# Patient Record
Sex: Male | Born: 1985 | Race: Black or African American | Hispanic: No | Marital: Married | Smoking: Current every day smoker
Health system: Southern US, Community
[De-identification: ages and names within clinical notes are randomized; demographics above are authoritative.]

## PROBLEM LIST (undated history)

## (undated) VITALS — BP 119/89 | HR 74 | Temp 98.4°F | Resp 18

## (undated) DIAGNOSIS — F319 Bipolar disorder, unspecified: Secondary | ICD-10-CM

## (undated) DIAGNOSIS — S02609A Fracture of mandible, unspecified, initial encounter for closed fracture: Secondary | ICD-10-CM

---
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
## 2007-01-15 ENCOUNTER — Emergency Department (HOSPITAL_COMMUNITY): Admission: EM | Admit: 2007-01-15 | Discharge: 2007-01-15 | Payer: Self-pay | Admitting: Emergency Medicine

## 2008-08-06 ENCOUNTER — Emergency Department (HOSPITAL_COMMUNITY): Admission: EM | Admit: 2008-08-06 | Discharge: 2008-08-06 | Payer: Self-pay | Admitting: Emergency Medicine

## 2008-09-10 ENCOUNTER — Emergency Department (HOSPITAL_COMMUNITY): Admission: EM | Admit: 2008-09-10 | Discharge: 2008-09-10 | Payer: Self-pay | Admitting: Emergency Medicine

## 2008-09-22 ENCOUNTER — Emergency Department (HOSPITAL_COMMUNITY): Admission: EM | Admit: 2008-09-22 | Discharge: 2008-09-22 | Payer: Self-pay | Admitting: Emergency Medicine

## 2008-10-01 ENCOUNTER — Emergency Department (HOSPITAL_COMMUNITY): Admission: EM | Admit: 2008-10-01 | Discharge: 2008-10-01 | Payer: Self-pay | Admitting: Emergency Medicine

## 2008-10-02 ENCOUNTER — Emergency Department (HOSPITAL_COMMUNITY): Admission: EM | Admit: 2008-10-02 | Discharge: 2008-10-02 | Payer: Self-pay | Admitting: Emergency Medicine

## 2008-10-20 ENCOUNTER — Emergency Department (HOSPITAL_COMMUNITY): Admission: EM | Admit: 2008-10-20 | Discharge: 2008-10-20 | Payer: Self-pay | Admitting: Emergency Medicine

## 2008-10-28 ENCOUNTER — Emergency Department (HOSPITAL_COMMUNITY): Admission: EM | Admit: 2008-10-28 | Discharge: 2008-10-28 | Payer: Self-pay | Admitting: Emergency Medicine

## 2008-11-04 ENCOUNTER — Emergency Department (HOSPITAL_COMMUNITY): Admission: EM | Admit: 2008-11-04 | Discharge: 2008-11-04 | Payer: Self-pay | Admitting: Emergency Medicine

## 2011-04-11 ENCOUNTER — Inpatient Hospital Stay (HOSPITAL_COMMUNITY)
Admission: EM | Admit: 2011-04-11 | Discharge: 2011-04-12 | DRG: 918 | Disposition: A | Discharge status: Home / Self Care | Payer: Self-pay | Attending: Family Medicine | Admitting: Family Medicine

## 2011-04-11 DIAGNOSIS — R4182 Altered mental status, unspecified: Secondary | ICD-10-CM | POA: Diagnosis present

## 2011-04-11 DIAGNOSIS — T43591A Poisoning by other antipsychotics and neuroleptics, accidental (unintentional), initial encounter: Secondary | ICD-10-CM | POA: Diagnosis present

## 2011-04-11 DIAGNOSIS — F39 Unspecified mood [affective] disorder: Secondary | ICD-10-CM

## 2011-04-11 DIAGNOSIS — F319 Bipolar disorder, unspecified: Secondary | ICD-10-CM | POA: Diagnosis present

## 2011-04-11 DIAGNOSIS — T426X1A Poisoning by other antiepileptic and sedative-hypnotic drugs, accidental (unintentional), initial encounter: Secondary | ICD-10-CM | POA: Diagnosis present

## 2011-04-11 DIAGNOSIS — F209 Schizophrenia, unspecified: Secondary | ICD-10-CM | POA: Diagnosis present

## 2011-04-11 DIAGNOSIS — T43294A Poisoning by other antidepressants, undetermined, initial encounter: Principal | ICD-10-CM | POA: Diagnosis present

## 2011-04-11 DIAGNOSIS — F17200 Nicotine dependence, unspecified, uncomplicated: Secondary | ICD-10-CM | POA: Diagnosis present

## 2011-04-11 LAB — CARBAMAZEPINE LEVEL, TOTAL: Carbamazepine Lvl: 39.6 ug/mL (ref 4.0–12.0)

## 2011-04-11 LAB — DIFFERENTIAL
Basophils Absolute: 0 10*3/uL (ref 0.0–0.1)
Basophils Relative: 1 % (ref 0–1)
Eosinophils Absolute: 0 10*3/uL (ref 0.0–0.7)
Eosinophils Relative: 0 % (ref 0–5)
Lymphocytes Relative: 21 % (ref 12–46)
Lymphs Abs: 0.9 10*3/uL (ref 0.7–4.0)
Monocytes Absolute: 0.4 10*3/uL (ref 0.1–1.0)
Monocytes Relative: 10 % (ref 3–12)
Neutro Abs: 2.7 10*3/uL (ref 1.7–7.7)
Neutrophils Relative %: 68 % (ref 43–77)

## 2011-04-11 LAB — CBC
HCT: 35.9 % — ABNORMAL LOW (ref 39.0–52.0)
MCHC: 33.4 g/dL (ref 30.0–36.0)
Platelets: 333 10*3/uL (ref 150–400)
RDW: 12.5 % (ref 11.5–15.5)
WBC: 4 10*3/uL (ref 4.0–10.5)

## 2011-04-11 LAB — ETHANOL: Alcohol, Ethyl (B): 78 mg/dL — ABNORMAL HIGH (ref 0–11)

## 2011-04-11 LAB — COMPREHENSIVE METABOLIC PANEL
ALT: 17 U/L (ref 0–53)
Albumin: 4.1 g/dL (ref 3.5–5.2)
BUN: 15 mg/dL (ref 6–23)
Calcium: 9.4 mg/dL (ref 8.4–10.5)
GFR calc Af Amer: >90 mL/min (ref >90)
Glucose, Bld: 112 mg/dL — ABNORMAL HIGH (ref 70–99)
Potassium: 3.4 mEq/L — ABNORMAL LOW (ref 3.5–5.1)
Sodium: 139 mEq/L (ref 135–145)
Total Protein: 7.8 g/dL (ref 6.0–8.3)

## 2011-04-11 LAB — GLUCOSE, CAPILLARY: Glucose-Capillary: 100 mg/dL — ABNORMAL HIGH (ref 70–99)

## 2011-04-11 LAB — VALPROIC ACID LEVEL: Valproic Acid Lvl: <10 ug/mL — ABNORMAL LOW (ref 50.0–100.0)

## 2011-04-11 LAB — LITHIUM LEVEL: Lithium Lvl: <0.25 mEq/L — ABNORMAL LOW (ref 0.80–1.40)

## 2011-04-12 LAB — CBC
MCH: 27.7 pg (ref 26.0–34.0)
MCV: 87.2 fL (ref 78.0–100.0)
Platelets: 321 10*3/uL (ref 150–400)
RDW: 12.7 % (ref 11.5–15.5)

## 2011-04-12 LAB — BASIC METABOLIC PANEL
Calcium: 9 mg/dL (ref 8.4–10.5)
Creatinine, Ser: 1.15 mg/dL (ref 0.50–1.35)
GFR calc Af Amer: >90 mL/min (ref >90)
GFR calc non Af Amer: 88 mL/min — ABNORMAL LOW (ref >90)
Sodium: 142 mEq/L (ref 135–145)

## 2011-04-12 LAB — CARBAMAZEPINE LEVEL, TOTAL: Carbamazepine Lvl: 17 ug/mL (ref 4.0–12.0)

## 2011-04-13 NOTE — H&P (Signed)
Edward Mora, Edward Mora             ACCOUNT NO.:  [id number]  MEDICAL RECORD NO.:  [id number]  LOCATION:  WLED                         FACILITY:  [facility]  PHYSICIAN:  Hollice Espy, M.D.DATE OF BIRTH:  September 15, 1985  DATE OF ADMISSION:  04/11/2011 DATE OF DISCHARGE:                             HISTORY & PHYSICAL   PRIMARY CARE DOCTOR:  The patient has no primary care doctor.  CHIEF COMPLAINT:  Overdose.  HISTORY OF PRESENT ILLNESS:  The patient is a 25 year old African American male who was just discharged from prison 1 day earlier, who takes Wellbutrin and Tegretol and was brought in for altered mental status.  He reportedly had been crushing up his Wellbutrin and Tegretol and snorting it.  It is unclear exactly how many he took, but he looked to be confused.  At times, he is busy and slightly agitated and tried to leaving was made on an involuntary commitment.  After discussion with the Poison Control, it was recommended that the patient be monitored on telemetry and Psychiatry be consulted to make sure that this was not an intentional suicide attempt.  In the meantime, his levels came back noting an alcohol level of 78 and a Tegretol level of 39.6 with a range of 4-12.  When I saw the patient, he was sleeping.  When I talked to him, he opened his eyes.  He shook his head when I asked him if he had any complaints, he was having any pain, he otherwise said no.  He looked to be otherwise doing well, but would not answer any other questions, I am unable to get any further review of systems.  PAST MEDICAL HISTORY: 1. History of schizophrenia. 2. Bipolar disorder. 3. Attention deficit disorder.  MEDICATIONS:  He is on Tegretol and Wellbutrin.  ALLERGIES:  No known drug allergies.  SOCIAL HISTORY:  Apparently, he is a smoker.  Has had problems in the past with drug use and drinks alcohol.  He was just released from prison 1 day prior.  He will not talk me and give me any  family history.  PHYSICAL EXAMINATION:  VITAL SIGNS:  On admission, temperature 98.7, heart rate 108 now down to 77, blood pressure 118/81 now up to 142/91, respirations 20, and O2 saturations are 100% on room air. GENERAL:  He appears to be alert, but I am unable to get his exact mentation to how oriented he is. HEENT:  Normocephalic, atraumatic.  Mucous membranes appear to be slightly dry. NECK:  He has no carotid bruits. HEART:  Regular rate and rhythm, S1 and S2. LUNGS:  Clear to auscultation bilaterally. ABDOMEN:  Soft, nontender, nondistended.  Positive bowel sounds. EXTREMITIES:  No clubbing, cyanosis, or edema.  Good 2+ pulses.  LABORATORY WORK:  White count 4, hemoglobin and hematocrit 12 and 36, MCV of 85, platelet count 333, no shift.  Sodium 139, potassium 3.4, chloride 104, bicarbonate 22, BUN 15, creatinine 1.22, glucose 112. LFTs are unremarkable.  Tylenol level less than 15.  Salicylate level less than 2.  Alcohol level 78.  Tricyclic drug screen and urine drug screen are pending.  He has not urinated.  Tegretol level is 40 with a range of 4-12,  Depakote level, lithium level, Tylenol level all normal.  IMAGING:  EKG at the time was taken, was noted some mild tachycardia. There is a question of some abnormal repolarization pattern, but I felt likely this may be an artifact.  ASSESSMENT AND PLAN: 1. Tegretol and Wellbutrin overdose, likely accidental, brought on by     trying to become high from medication by snorting it.  We will plan     to watch the patient on telemetry, keep a sitter in his room until     he is cleared by Psychiatry.  He has been involuntarily committed.     Repeat Tegretol level in the morning.  If Psychiatry clears him and     his Tegretol level is normalized, he could go home. 2. History of bipolar disorder. 3. History of schizophrenia. 4. History of attention deficit disorder. 5. History of seizure disorder.  Please also note that all  diagnoses were present on admission.     Hollice Espy, M.D.     SKK/MEDQ  D:  04/11/2011  T:  04/11/2011  Job:  [id number]  Electronically Signed by Virginia Rochester M.D. on 04/13/2011 10:21:32 AM

## 2011-04-16 NOTE — Discharge Summary (Signed)
  NAMEEDWAR, COE             ACCOUNT NO.:  [id number]  MEDICAL RECORD NO.:  [id number]  LOCATION:  1517                         FACILITY:  [facility]  PHYSICIAN:  Edward Sacks, Edward Mora    DATE OF BIRTH:  Nov 10, 1985  DATE OF ADMISSION:  04/11/2011 DATE OF DISCHARGE:  04/12/2011                              DISCHARGE SUMMARY   DISCHARGE DIAGNOSES: 1. Tegretol and Wellbutrin overdose. 2. History of bipolar and schizophrenia.  HPI:  This is a 25 year old man who was just discharged from prison, who Wellbutrin and Tegretol, who was brought in for altered mental status. He reportedly had been crushing his Wellbutrin and Tegretol and snorting it.  The patient tells me he just took multiple Tegretol in an attempt to get high.  He was found to be intoxicated and have an elevated Tegretol level, so he was admitted for observation.  HOSPITAL COURSE:  Edward Mora has had no arrhythmias and no complications or sequela from his ingestion.  He adamantly denies any suicidal thoughts or plans.  He states he took this medication to get high and that he will not do it again.  I discussed the case with Dr. Rogers Blocker of Psychiatry who does not feel that the patient is a danger to himself or others and has cleared him for discharge.  Therefore, the patient will be discharged home.  CONSULTATIONS:  Dr. Rogers Blocker of Psychiatry.  RECOMMENDATIONS:  As above.  PROCEDURES:  None.  IMAGING:  None.  MICROBIOLOGY:  None.  PERTINENT LABORATORY STUDIES: 1. CBC notable for hemoglobin of 10.8 to 12.0. 2. Basic metabolic panel, unremarkable. 3. Acetaminophen levels were negative. 4. Initial Tegretol level was 39.6, second level 17.0, third level     pending. 5. Lithium and salicylate levels negative. 6. Valproic acid level negative. 7. Alcohol level of 78.  DISCHARGE INSTRUCTIONS:  Patient will be discharged home.  Diet and activity is unrestricted.  He will follow up with [facility]  for eligibility and with Dr. Clelia Croft, December 14th at 10.45 a.m.  DISCHARGE MEDICATIONS:  No changes made.  He should continue Tegretol and Wellbutrin as directed by his outpatient physician.  Time coordinating discharge is 20 minutes.     Edward Sacks, Edward Mora     DG/MEDQ  D:  04/12/2011  T:  04/13/2011  Job:  [id number]  Electronically Signed by Edward Mora  on 04/16/2011 02:58:48 PM

## 2014-09-04 ENCOUNTER — Encounter (HOSPITAL_COMMUNITY): Payer: Self-pay | Admitting: *Deleted

## 2014-09-04 ENCOUNTER — Emergency Department (HOSPITAL_COMMUNITY)
Admission: EM | Admit: 2014-09-04 | Discharge: 2014-09-04 | Disposition: A | Discharge status: Home / Self Care | Payer: Self-pay | Attending: Emergency Medicine | Admitting: Emergency Medicine

## 2014-09-04 DIAGNOSIS — R109 Unspecified abdominal pain: Secondary | ICD-10-CM | POA: Insufficient documentation

## 2014-09-04 DIAGNOSIS — M545 Low back pain: Secondary | ICD-10-CM | POA: Insufficient documentation

## 2014-09-04 DIAGNOSIS — Z720 Tobacco use: Secondary | ICD-10-CM | POA: Insufficient documentation

## 2014-09-04 DIAGNOSIS — R252 Cramp and spasm: Secondary | ICD-10-CM | POA: Insufficient documentation

## 2014-09-04 LAB — COMPREHENSIVE METABOLIC PANEL
ALT: 23 U/L (ref 0–53)
AST: 49 U/L — AB (ref 0–37)
Albumin: 3.7 g/dL (ref 3.5–5.2)
Alkaline Phosphatase: 75 U/L (ref 39–117)
Anion gap: 9 (ref 5–15)
BUN: 9 mg/dL (ref 6–23)
CHLORIDE: 107 mmol/L (ref 96–112)
CO2: 24 mmol/L (ref 19–32)
Calcium: 9 mg/dL (ref 8.4–10.5)
Creatinine, Ser: 1.22 mg/dL (ref 0.50–1.35)
GFR calc Af Amer: >90 mL/min (ref >90)
GFR, EST NON AFRICAN AMERICAN: 80 mL/min — AB (ref >90)
Glucose, Bld: 93 mg/dL (ref 70–99)
Potassium: 3.9 mmol/L (ref 3.5–5.1)
Sodium: 140 mmol/L (ref 135–145)
TOTAL PROTEIN: 7.5 g/dL (ref 6.0–8.3)
Total Bilirubin: 0.6 mg/dL (ref 0.3–1.2)

## 2014-09-04 LAB — LIPASE, BLOOD: LIPASE: 23 U/L (ref 11–59)

## 2014-09-04 LAB — CBC WITH DIFFERENTIAL/PLATELET
BASOS ABS: 0 10*3/uL (ref 0.0–0.1)
Basophils Relative: 1 % (ref 0–1)
EOS PCT: 2 % (ref 0–5)
Eosinophils Absolute: 0.1 10*3/uL (ref 0.0–0.7)
HCT: 39.7 % (ref 39.0–52.0)
Hemoglobin: 12.5 g/dL — ABNORMAL LOW (ref 13.0–17.0)
LYMPHS ABS: 2.4 10*3/uL (ref 0.7–4.0)
Lymphocytes Relative: 49 % — ABNORMAL HIGH (ref 12–46)
MCH: 28.3 pg (ref 26.0–34.0)
MCHC: 31.5 g/dL (ref 30.0–36.0)
MCV: 89.8 fL (ref 78.0–100.0)
Monocytes Absolute: 0.7 10*3/uL (ref 0.1–1.0)
Monocytes Relative: 13 % — ABNORMAL HIGH (ref 3–12)
Neutro Abs: 1.7 10*3/uL (ref 1.7–7.7)
Neutrophils Relative %: 35 % — ABNORMAL LOW (ref 43–77)
Platelets: 346 10*3/uL (ref 150–400)
RBC: 4.42 MIL/uL (ref 4.22–5.81)
RDW: 13.9 % (ref 11.5–15.5)
WBC: 4.9 10*3/uL (ref 4.0–10.5)

## 2014-09-04 MED ORDER — KETOROLAC TROMETHAMINE 30 MG/ML IJ SOLN
30.0000 mg | Freq: Once | INTRAMUSCULAR | Status: AC
  Administered 2014-09-04: 30 mg via INTRAVENOUS
  Filled 2014-09-04: qty 1

## 2014-09-04 MED ORDER — KETOROLAC TROMETHAMINE 60 MG/2ML IM SOLN: 60.0000 mg | Freq: Once | INTRAMUSCULAR | Status: DC

## 2014-09-04 MED ORDER — IBUPROFEN 600 MG PO TABS: 600.0000 mg | ORAL_TABLET | Freq: Four times a day (QID) | ORAL | Status: DC | PRN

## 2014-09-04 MED ORDER — CYCLOBENZAPRINE HCL 10 MG PO TABS
5.0000 mg | ORAL_TABLET | Freq: Once | ORAL | Status: AC
  Administered 2014-09-04: 5 mg via ORAL
  Filled 2014-09-04: qty 1

## 2014-09-04 MED ORDER — OXYCODONE-ACETAMINOPHEN 5-325 MG PO TABS
1.0000 | ORAL_TABLET | Freq: Once | ORAL | Status: AC
  Administered 2014-09-04: 1 via ORAL
  Filled 2014-09-04: qty 1

## 2014-09-04 MED ORDER — CYCLOBENZAPRINE HCL 10 MG PO TABS: 10.0000 mg | ORAL_TABLET | Freq: Three times a day (TID) | ORAL | Status: DC | PRN

## 2014-09-04 NOTE — ED Notes (Signed)
Patient reports sudden onset of right sided flank pain associated with nausea and vomiting. Pain is constant in nature. Reports increased pain with movement.

## 2014-09-04 NOTE — Discharge Instructions (Signed)
Back Pain, Adult Low back pain is very common. About 1 in 5 people have back pain.The cause of low back pain is rarely dangerous. The pain often gets better over time.About half of people with a sudden onset of back pain feel better in just 2 weeks. About 8 in 10 people feel better by 6 weeks.  CAUSES Some common causes of back pain include:  Strain of the muscles or ligaments supporting the spine.  Wear and tear (degeneration) of the spinal discs.  Arthritis.  Direct injury to the back. DIAGNOSIS Most of the time, the direct cause of low back pain is not known.However, back pain can be treated effectively even when the exact cause of the pain is unknown.Answering your caregiver's questions about your overall health and symptoms is one of the most accurate ways to make sure the cause of your pain is not dangerous. If your caregiver needs more information, he or she may order lab work or imaging tests (X-rays or MRIs).However, even if imaging tests show changes in your back, this usually does not require surgery. HOME CARE INSTRUCTIONS For many people, back pain returns.Since low back pain is rarely dangerous, it is often a condition that people can learn to manageon their own.   Remain active. It is stressful on the back to sit or stand in one place. Do not sit, drive, or stand in one place for more than 30 minutes at a time. Take short walks on level surfaces as soon as pain allows.Try to increase the length of time you walk each day.  Do not stay in bed.Resting more than 1 or 2 days can delay your recovery.  Do not avoid exercise or work.Your body is made to move.It is not dangerous to be active, even though your back may hurt.Your back will likely heal faster if you return to being active before your pain is gone.  Pay attention to your body when you bend and lift. Many people have less discomfortwhen lifting if they bend their knees, keep the load close to their bodies,and  avoid twisting. Often, the most comfortable positions are those that put less stress on your recovering back.  Find a comfortable position to sleep. Use a firm mattress and lie on your side with your knees slightly bent. If you lie on your back, put a pillow under your knees.  Only take over-the-counter or prescription medicines as directed by your caregiver. Over-the-counter medicines to reduce pain and inflammation are often the most helpful.Your caregiver may prescribe muscle relaxant drugs.These medicines help dull your pain so you can more quickly return to your normal activities and healthy exercise.  Put ice on the injured area.  Put ice in a plastic bag.  Place a towel between your skin and the bag.  Leave the ice on for 15-20 minutes, 03-04 times a day for the first 2 to 3 days. After that, ice and heat may be alternated to reduce pain and spasms.  Ask your caregiver about trying back exercises and gentle massage. This may be of some benefit.  Avoid feeling anxious or stressed.Stress increases muscle tension and can worsen back pain.It is important to recognize when you are anxious or stressed and learn ways to manage it.Exercise is a great option. SEEK MEDICAL CARE IF:  You have pain that is not relieved with rest or medicine.  You have pain that does not improve in 1 week.  You have new symptoms.  You are generally not feeling well. SEEK   IMMEDIATE MEDICAL CARE IF:   You have pain that radiates from your back into your legs.  You develop new bowel or bladder control problems.  You have unusual weakness or numbness in your arms or legs.  You develop nausea or vomiting.  You develop abdominal pain.  You feel faint. Document Released: 06/07/2005 Document Revised: 12/07/2011 Document Reviewed: 10/09/2013 Exit[employer] Patient Information 2015 [employer]. This information is not intended to replace advice given to you by your health care provider. Make sure you  discuss any questions you have with your health care provider.  

## 2014-09-04 NOTE — ED Provider Notes (Signed)
CSN: [id number]     Arrival date & time 09/04/14  2037 History  This chart was scribed for Azalia Bilis, MD by Abel Presto, ED Scribe. This patient was seen in room A09C/A09C and the patient's care was started at 11:20 PM.     Chief Complaint  Patient presents with  . Flank Pain    Patient is a 29 y.o. male presenting with flank pain. The history is provided by the patient. No language interpreter was used.  Flank Pain   HPI Comments: Edward Mora is a 29 y.o. male who presents to the Emergency Department complaining of constant right sided flank pain with onset 2 days ago. Pt notes he was at rest at onset. Pt notes injury to the area from a fall a year ago, stating he experiences intermitting pain to the area. Pt notes movement aggravates the pain.  Pt has taken Tylenol for relief. Pt does not have a PCP. Pt denies dysuria, testicular pain, and urinary frequency.   No past medical history on file. No past surgical history on file. No family history on file. History  Substance Use Topics  . Smoking status: Current Every Day Smoker  . Smokeless tobacco: Not on file  . Alcohol Use: Yes     Comment: occ    Review of Systems  Genitourinary: Positive for flank pain.  A complete 10 system review of systems was obtained and all systems are negative except as noted in the HPI and PMH.      Allergies  Review of patient's allergies indicates no known allergies.  Home Medications   Prior to Admission medications   Not on File   BP 126/76 mmHg  Pulse 56  Temp(Src) 98.6 F (37 C) (Oral)  Resp 18  SpO2 98% Physical Exam  Constitutional: He is oriented to person, place, and time. He appears well-developed and well-nourished.  HENT:  Head: Normocephalic.  Eyes: EOM are normal.  Neck: Normal range of motion.  Pulmonary/Chest: Effort normal.  Abdominal: He exhibits no distension.  Musculoskeletal: Normal range of motion.       Lumbar back: He exhibits tenderness and spasm.   No midline lumbar tenderness Mild right paralumbar tenderness with spasm  Neurological: He is alert and oriented to person, place, and time.  Skin: No rash noted.  Psychiatric: He has a normal mood and affect.  Nursing note and vitals reviewed.   ED Course  Procedures (including critical care time) DIAGNOSTIC STUDIES: Oxygen Saturation is 98% on room air, normal by my interpretation.    COORDINATION OF CARE: 11:23 PM Discussed treatment plan with patient at beside, the patient agrees with the plan and has no further questions at this time.   Labs Review Labs Reviewed  CBC WITH DIFFERENTIAL/PLATELET - Abnormal; Notable for the following:    Hemoglobin 12.5 (*)    Neutrophils Relative % 35 (*)    Lymphocytes Relative 49 (*)    Monocytes Relative 13 (*)    All other components within normal limits  COMPREHENSIVE METABOLIC PANEL - Abnormal; Notable for the following:    AST 49 (*)    GFR calc non Af Amer 80 (*)    All other components within normal limits  LIPASE, BLOOD  URINALYSIS, ROUTINE W REFLEX MICROSCOPIC    Imaging Review No results found.   EKG Interpretation None      MDM   Final diagnoses:  None   Patient is overall well-appearing.  This appears to be musculoskeletal type symptoms.  Doubt intra-abdominal pathology.  No indication for imaging.  Patient given Percocet while in the emergency department.  Patient be discharged home with anti-inflammatories and muscle relaxants.  I personally performed the services described in this documentation, which was scribed in my presence. The recorded information has been reviewed and is accurate.        Azalia BilisKevin Emberleigh Reily, MD 09/06/14 [phone]

## 2014-09-05 LAB — URINALYSIS, ROUTINE W REFLEX MICROSCOPIC
BILIRUBIN URINE: NEGATIVE
Glucose, UA: NEGATIVE mg/dL
HGB URINE DIPSTICK: NEGATIVE
Ketones, ur: NEGATIVE mg/dL
LEUKOCYTES UA: NEGATIVE
NITRITE: NEGATIVE
PROTEIN: NEGATIVE mg/dL
SPECIFIC GRAVITY, URINE: 1.024 (ref 1.005–1.030)
UROBILINOGEN UA: 1 mg/dL (ref 0.0–1.0)
pH: 7.5 (ref 5.0–8.0)

## 2014-12-30 ENCOUNTER — Emergency Department (HOSPITAL_COMMUNITY): Payer: Self-pay

## 2014-12-30 ENCOUNTER — Encounter (HOSPITAL_COMMUNITY): Payer: Self-pay | Admitting: *Deleted

## 2014-12-30 ENCOUNTER — Emergency Department (HOSPITAL_COMMUNITY)
Admission: EM | Admit: 2014-12-30 | Discharge: 2014-12-30 | Disposition: A | Discharge status: Home / Self Care | Payer: Self-pay | Attending: Emergency Medicine | Admitting: Emergency Medicine

## 2014-12-30 DIAGNOSIS — M25561 Pain in right knee: Secondary | ICD-10-CM | POA: Insufficient documentation

## 2014-12-30 DIAGNOSIS — Z8781 Personal history of (healed) traumatic fracture: Secondary | ICD-10-CM | POA: Insufficient documentation

## 2014-12-30 DIAGNOSIS — M25461 Effusion, right knee: Secondary | ICD-10-CM | POA: Insufficient documentation

## 2014-12-30 DIAGNOSIS — Z720 Tobacco use: Secondary | ICD-10-CM | POA: Insufficient documentation

## 2014-12-30 MED ORDER — HYDROCODONE-ACETAMINOPHEN 5-325 MG PO TABS: 2.0000 | ORAL_TABLET | ORAL | Status: DC | PRN

## 2014-12-30 MED ORDER — IBUPROFEN 600 MG PO TABS: 600.0000 mg | ORAL_TABLET | Freq: Four times a day (QID) | ORAL | Status: DC | PRN

## 2014-12-30 MED ORDER — HYDROCODONE-ACETAMINOPHEN 5-325 MG PO TABS
2.0000 | ORAL_TABLET | Freq: Once | ORAL | Status: AC
  Administered 2014-12-30: 2 via ORAL
  Filled 2014-12-30: qty 2

## 2014-12-30 NOTE — ED Notes (Signed)
Patient with onset of right knee pain today.  Denies trauma.  He states he has more pain when walking.  Denies old injuries.  No pain meds prior to arrival

## 2014-12-30 NOTE — ED Notes (Signed)
Pt returned from X-ray.  

## 2014-12-30 NOTE — Discharge Instructions (Signed)
Arthritis, Nonspecific °Arthritis is inflammation of a joint. This usually means pain, redness, warmth or swelling are present. One or more joints may be involved. There are a number of types of arthritis. Your caregiver may not be able to tell what type of arthritis you have right away. °CAUSES  °The most common cause of arthritis is the wear and tear on the joint (osteoarthritis). This causes damage to the cartilage, which can break down over time. The knees, hips, back and neck are most often affected by this type of arthritis. °Other types of arthritis and common causes of joint pain include: °· Sprains and other injuries near the joint. Sometimes minor sprains and injuries cause pain and swelling that develop hours later. °· Rheumatoid arthritis. This affects hands, feet and knees. It usually affects both sides of your body at the same time. It is often associated with chronic ailments, fever, weight loss and general weakness. °· Crystal arthritis. Gout and pseudo gout can cause occasional acute severe pain, redness and swelling in the foot, ankle, or knee. °· Infectious arthritis. Bacteria can get into a joint through a break in overlying skin. This can cause infection of the joint. Bacteria and viruses can also spread through the blood and affect your joints. °· Drug, infectious and allergy reactions. Sometimes joints can become mildly painful and slightly swollen with these types of illnesses. °SYMPTOMS  °· Pain is the main symptom. °· Your joint or joints can also be red, swollen and warm or hot to the touch. °· You may have a fever with certain types of arthritis, or even feel overall ill. °· The joint with arthritis will hurt with movement. Stiffness is present with some types of arthritis. °DIAGNOSIS  °Your caregiver will suspect arthritis based on your description of your symptoms and on your exam. Testing may be needed to find the type of arthritis: °· Blood and sometimes urine tests. °· X-ray tests  and sometimes CT or MRI scans. °· Removal of fluid from the joint (arthrocentesis) is done to check for bacteria, crystals or other causes. Your caregiver (or a specialist) will numb the area over the joint with a local anesthetic, and use a needle to remove joint fluid for examination. This procedure is only minimally uncomfortable. °· Even with these tests, your caregiver may not be able to tell what kind of arthritis you have. Consultation with a specialist (rheumatologist) may be helpful. °TREATMENT  °Your caregiver will discuss with you treatment specific to your type of arthritis. If the specific type cannot be determined, then the following general recommendations may apply. °Treatment of severe joint pain includes: °· Rest. °· Elevation. °· Anti-inflammatory medication (for example, ibuprofen) may be prescribed. Avoiding activities that cause increased pain. °· Only take over-the-counter or prescription medicines for pain and discomfort as recommended by your caregiver. °· Cold packs over an inflamed joint may be used for 10 to 15 minutes every hour. Hot packs sometimes feel better, but do not use overnight. Do not use hot packs if you are diabetic without your caregiver's permission. °· A cortisone shot into arthritic joints may help reduce pain and swelling. °· Any acute arthritis that gets worse over the next 1 to 2 days needs to be looked at to be sure there is no joint infection. °Long-term arthritis treatment involves modifying activities and lifestyle to reduce joint stress jarring. This can include weight loss. Also, exercise is needed to nourish the joint cartilage and remove waste. This helps keep the muscles   around the joint strong. °HOME CARE INSTRUCTIONS  °· Do not take aspirin to relieve pain if gout is suspected. This elevates uric acid levels. °· Only take over-the-counter or prescription medicines for pain, discomfort or fever as directed by your caregiver. °· Rest the joint as much as  possible. °· If your joint is swollen, keep it elevated. °· Use crutches if the painful joint is in your leg. °· Drinking plenty of fluids may help for certain types of arthritis. °· Follow your caregiver's dietary instructions. °· Try low-impact exercise such as: °¨ Swimming. °¨ Water aerobics. °¨ Biking. °¨ Walking. °· Morning stiffness is often relieved by a warm shower. °· Put your joints through regular range-of-motion. °SEEK MEDICAL CARE IF:  °· You do not feel better in 24 hours or are getting worse. °· You have side effects to medications, or are not getting better with treatment. °SEEK IMMEDIATE MEDICAL CARE IF:  °· You have a fever. °· You develop severe joint pain, swelling or redness. °· Many joints are involved and become painful and swollen. °· There is severe back pain and/or leg weakness. °· You have loss of bowel or bladder control. °Document Released: 07/15/2004 Document Revised: 08/30/2011 Document Reviewed: 07/31/2008 °[employer]® Patient Information ©2015 [employer]. This information is not intended to replace advice given to you by your health care provider. Make sure you discuss any questions you have with your health care provider. ° °
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
## 2015-01-25 ENCOUNTER — Encounter (HOSPITAL_COMMUNITY): Payer: Self-pay | Admitting: *Deleted

## 2015-01-25 ENCOUNTER — Emergency Department (HOSPITAL_COMMUNITY)
Admission: EM | Admit: 2015-01-25 | Discharge: 2015-01-25 | Disposition: A | Discharge status: Home / Self Care | Payer: Self-pay | Attending: Emergency Medicine | Admitting: Emergency Medicine

## 2015-01-25 ENCOUNTER — Emergency Department (HOSPITAL_BASED_OUTPATIENT_CLINIC_OR_DEPARTMENT_OTHER): Admit: 2015-01-25 | Discharge: 2015-01-25 | Disposition: A | Discharge status: Home / Self Care | Payer: Self-pay

## 2015-01-25 DIAGNOSIS — Z720 Tobacco use: Secondary | ICD-10-CM | POA: Insufficient documentation

## 2015-01-25 DIAGNOSIS — M79609 Pain in unspecified limb: Secondary | ICD-10-CM

## 2015-01-25 DIAGNOSIS — M25561 Pain in right knee: Secondary | ICD-10-CM | POA: Insufficient documentation

## 2015-01-25 MED ORDER — CYCLOBENZAPRINE HCL 10 MG PO TABS: 10.0000 mg | ORAL_TABLET | Freq: Two times a day (BID) | ORAL | Status: DC | PRN

## 2015-01-25 MED ORDER — HYDROCODONE-ACETAMINOPHEN 5-325 MG PO TABS
2.0000 | ORAL_TABLET | Freq: Once | ORAL | Status: AC
  Administered 2015-01-25: 2 via ORAL
  Filled 2015-01-25: qty 2

## 2015-01-25 MED ORDER — IBUPROFEN 800 MG PO TABS: 800.0000 mg | ORAL_TABLET | Freq: Three times a day (TID) | ORAL | Status: DC

## 2015-01-25 NOTE — ED Provider Notes (Signed)
CSN: [id number]     Arrival date & time 01/25/15  1546 History   First MD Initiated Contact with Patient 01/25/15 1603     Chief Complaint  Patient presents with  . Leg Pain     (Consider location/radiation/quality/duration/timing/severity/associated sxs/prior Treatment) Patient is a 29 y.o. male presenting with leg pain.  Leg Pain Location:  Knee Time since incident:  5 days Injury: no   Knee location:  R knee Pain details:    Quality:  Aching   Radiates to:  Does not radiate   Severity:  Moderate   Onset quality:  Gradual   Duration:  5 days   Timing:  Constant   Progression:  Unchanged Chronicity:  New Prior injury to area:  No Relieved by:  Nothing Worsened by:  Nothing tried Associated symptoms: no fever     History reviewed. No pertinent past medical history. History reviewed. No pertinent past surgical history. History reviewed. No pertinent family history. History  Substance Use Topics  . Smoking status: Current Every Day Smoker  . Smokeless tobacco: Not on file  . Alcohol Use: Yes     Comment: occ    Review of Systems  Constitutional: Negative for fever.  Respiratory: Negative for cough and shortness of breath.   All other systems reviewed and are negative.     Allergies  Review of patient's allergies indicates no known allergies.  Home Medications   Prior to Admission medications   Medication Sig Start Date End Date Taking? Authorizing Provider  cyclobenzaprine (FLEXERIL) 10 MG tablet Take 1 tablet (10 mg total) by mouth 3 (three) times daily as needed for muscle spasms. 09/04/14   Azalia Bilis, MD  HYDROcodone-acetaminophen (NORCO/VICODIN) 5-325 MG per tablet Take 2 tablets by mouth every 4 (four) hours as needed. 12/30/14   Elson Areas, PA-C  ibuprofen (ADVIL,MOTRIN) 600 MG tablet Take 1 tablet (600 mg total) by mouth every 6 (six) hours as needed. 12/30/14   Elson Areas, PA-C   BP 121/76 mmHg  Pulse 63  Temp(Src) 98.2 F (36.8 C) (Oral)   Resp 20  Ht  (1.854 m)  Wt 197 lb 8 oz (89.585 kg)  BMI 26.06 kg/m2  SpO2 99% Physical Exam  Constitutional: He is oriented to person, place, and time. He appears well-developed and well-nourished. No distress.  HENT:  Head: Normocephalic and atraumatic.  Mouth/Throat: No oropharyngeal exudate.  Eyes: EOM are normal. Pupils are equal, round, and reactive to light.  Neck: Normal range of motion. Neck supple.  Cardiovascular: Normal rate and regular rhythm.  Exam reveals no friction rub.   No murmur heard. Pulmonary/Chest: Effort normal and breath sounds normal. No respiratory distress. He has no wheezes. He has no rales.  Abdominal: He exhibits no distension. There is no tenderness. There is no rebound.  Musculoskeletal: Normal range of motion. He exhibits no edema.       Right knee: He exhibits normal range of motion, no swelling, no effusion, no laceration, no erythema, no LCL laxity, no bony tenderness, normal meniscus and no MCL laxity. Tenderness (mild, posterior) found.  Neurological: He is alert and oriented to person, place, and time.  Skin: He is not diaphoretic.  Nursing note and vitals reviewed.   ED Course  Procedures (including critical care time) Labs Review Labs Reviewed - No data to display  Imaging Review No results found.   EKG Interpretation None     Author: Kern Alberta, RVS Service: Vascular Lab Author Type: Cardiovascular  Sonographer    Filed: 01/25/2015 5:01 PM Note Time: 01/25/2015 5:01 PM Status: Signed   Editor: Kern Alberta, RVS (Cardiovascular Sonographer)     Expand All Collapse All   VASCULAR LAB PRELIMINARY PRELIMINARY PRELIMINARY PRELIMINARY  Right lower extremity venous duplex completed.   Preliminary report: There is no DVT, SVT, or Baker's cyst noted in the right lower extremity.   KANADY, CANDACE, RVT 01/25/2015, 5:01 PM      MDM   Final diagnoses:  Right knee pain    29 year old male who is right knee pain.  Present for the past several days. No fever. Most of the pain is in the posterior knee. Denies any knee swelling. He was seen about one month ago for knee pain and had a negative x-ray. He denies any trauma. He is neurovascularly intact in his feet. Will ultrasound look for possible Baker's cyst.  [place] negative. Given motrin and flexeril for pain. Given Ortho f/u.  Elwin Mocha, MD 01/28/15 [phone]

## 2015-01-25 NOTE — Discharge Instructions (Signed)

## 2015-01-25 NOTE — ED Notes (Signed)
Pt verbalizes understanding of d/c instructions and denies any further needs at this time. 

## 2015-01-25 NOTE — Progress Notes (Signed)
VASCULAR LAB PRELIMINARY  PRELIMINARY  PRELIMINARY  PRELIMINARY  Right lower extremity venous duplex completed.    Preliminary report:  There is no DVT, SVT, or Baker's cyst noted in the right lower extremity.   Gay Moncivais, RVT 01/25/2015, 5:01 PM

## 2015-01-25 NOTE — ED Notes (Signed)
Pt reports pain to posterior right leg, behind his knee. Denies injury or swelling.

## 2015-01-25 NOTE — ED Notes (Signed)
Patient transported to Ultrasound 

## 2016-02-03 ENCOUNTER — Emergency Department (HOSPITAL_COMMUNITY): Payer: Self-pay

## 2016-02-03 ENCOUNTER — Encounter (HOSPITAL_COMMUNITY): Payer: Self-pay | Admitting: Emergency Medicine

## 2016-02-03 ENCOUNTER — Inpatient Hospital Stay (HOSPITAL_COMMUNITY)
Admission: EM | Admit: 2016-02-03 | Discharge: 2016-02-07 | DRG: 982 | Disposition: A | Discharge status: Home / Self Care | Payer: Self-pay | Attending: Surgery | Admitting: Surgery

## 2016-02-03 ENCOUNTER — Emergency Department (HOSPITAL_COMMUNITY): Payer: Self-pay | Admitting: Certified Registered"

## 2016-02-03 ENCOUNTER — Encounter (HOSPITAL_COMMUNITY): Admission: EM | Disposition: A | Discharge status: Home / Self Care | Payer: Self-pay | Source: Home / Self Care

## 2016-02-03 DIAGNOSIS — W3400XA Accidental discharge from unspecified firearms or gun, initial encounter: Secondary | ICD-10-CM

## 2016-02-03 DIAGNOSIS — S37031A Laceration of right kidney, unspecified degree, initial encounter: Principal | ICD-10-CM | POA: Diagnosis present

## 2016-02-03 DIAGNOSIS — S37001A Unspecified injury of right kidney, initial encounter: Secondary | ICD-10-CM | POA: Diagnosis present

## 2016-02-03 DIAGNOSIS — R509 Fever, unspecified: Secondary | ICD-10-CM

## 2016-02-03 DIAGNOSIS — S31139A Puncture wound of abdominal wall without foreign body, unspecified quadrant without penetration into peritoneal cavity, initial encounter: Secondary | ICD-10-CM

## 2016-02-03 DIAGNOSIS — Z9911 Dependence on respirator [ventilator] status: Secondary | ICD-10-CM

## 2016-02-03 DIAGNOSIS — D62 Acute posthemorrhagic anemia: Secondary | ICD-10-CM | POA: Diagnosis present

## 2016-02-03 DIAGNOSIS — S36119A Unspecified injury of liver, initial encounter: Secondary | ICD-10-CM | POA: Diagnosis present

## 2016-02-03 DIAGNOSIS — R578 Other shock: Secondary | ICD-10-CM

## 2016-02-03 DIAGNOSIS — F319 Bipolar disorder, unspecified: Secondary | ICD-10-CM | POA: Diagnosis present

## 2016-02-03 DIAGNOSIS — R310 Gross hematuria: Secondary | ICD-10-CM | POA: Diagnosis present

## 2016-02-03 HISTORY — PX: LAPAROTOMY: SHX154

## 2016-02-03 HISTORY — DX: Bipolar disorder, unspecified: F31.9

## 2016-02-03 LAB — CBC
HCT: 34.6 % — ABNORMAL LOW (ref 39.0–52.0)
HCT: 35.8 % — ABNORMAL LOW (ref 39.0–52.0)
Hemoglobin: 10.8 g/dL — ABNORMAL LOW (ref 13.0–17.0)
Hemoglobin: 11.3 g/dL — ABNORMAL LOW (ref 13.0–17.0)
MCH: 28.3 pg (ref 26.0–34.0)
MCH: 27.4 pg (ref 26.0–34.0)
MCHC: 31.2 g/dL (ref 30.0–36.0)
MCHC: 31.6 g/dL (ref 30.0–36.0)
MCV: 90.6 fL (ref 78.0–100.0)
MCV: 86.9 fL (ref 78.0–100.0)
PLATELETS: 429 10*3/uL — AB (ref 150–400)
PLATELETS: 307 10*3/uL (ref 150–400)
RBC: 3.82 MIL/uL — ABNORMAL LOW (ref 4.22–5.81)
RBC: 4.12 MIL/uL — ABNORMAL LOW (ref 4.22–5.81)
RDW: 13.2 % (ref 11.5–15.5)
RDW: 13.3 % (ref 11.5–15.5)
WBC: 10.7 10*3/uL — ABNORMAL HIGH (ref 4.0–10.5)
WBC: 11.5 10*3/uL — ABNORMAL HIGH (ref 4.0–10.5)

## 2016-02-03 LAB — PROTIME-INR
INR: 1.23
INR: 1.28
PROTHROMBIN TIME: 15.6 s — AB (ref 11.4–15.2)
Prothrombin Time: 16.1 seconds — ABNORMAL HIGH (ref 11.4–15.2)

## 2016-02-03 LAB — CDS SEROLOGY

## 2016-02-03 LAB — I-STAT CHEM 8, ED
BUN: 21 mg/dL — ABNORMAL HIGH (ref 6–20)
CHLORIDE: 102 mmol/L (ref 101–111)
CREATININE: 1.6 mg/dL — AB (ref 0.61–1.24)
Calcium, Ion: 1.09 mmol/L — ABNORMAL LOW (ref 1.13–1.30)
GLUCOSE: 163 mg/dL — AB (ref 65–99)
HEMATOCRIT: 37 % — AB (ref 39.0–52.0)
HEMOGLOBIN: 12.6 g/dL — AB (ref 13.0–17.0)
POTASSIUM: 3.2 mmol/L — AB (ref 3.5–5.1)
Sodium: 141 mmol/L (ref 135–145)
TCO2: 17 mmol/L (ref 0–100)

## 2016-02-03 LAB — TRIGLYCERIDES: Triglycerides: 86 mg/dL (ref <150)

## 2016-02-03 LAB — COMPREHENSIVE METABOLIC PANEL
ALK PHOS: 76 U/L (ref 38–126)
ALT: 165 U/L — AB (ref 17–63)
ALT: 378 U/L — AB (ref 17–63)
AST: 178 U/L — AB (ref 15–41)
AST: 373 U/L — ABNORMAL HIGH (ref 15–41)
Albumin: 3.9 g/dL (ref 3.5–5.0)
Albumin: 3.9 g/dL (ref 3.5–5.0)
Alkaline Phosphatase: 62 U/L (ref 38–126)
Anion gap: 19 — ABNORMAL HIGH (ref 5–15)
Anion gap: 12 (ref 5–15)
BUN: 18 mg/dL (ref 6–20)
BUN: 14 mg/dL (ref 6–20)
CALCIUM: 8.8 mg/dL — AB (ref 8.9–10.3)
CHLORIDE: 104 mmol/L (ref 101–111)
CHLORIDE: 108 mmol/L (ref 101–111)
CO2: 15 mmol/L — AB (ref 22–32)
CO2: 18 mmol/L — ABNORMAL LOW (ref 22–32)
CREATININE: 1.59 mg/dL — AB (ref 0.61–1.24)
CREATININE: 1.18 mg/dL (ref 0.61–1.24)
Calcium: 8 mg/dL — ABNORMAL LOW (ref 8.9–10.3)
GFR calc non Af Amer: 57 mL/min — ABNORMAL LOW (ref >60)
Glucose, Bld: 170 mg/dL — ABNORMAL HIGH (ref 65–99)
Glucose, Bld: 127 mg/dL — ABNORMAL HIGH (ref 65–99)
POTASSIUM: 3.4 mmol/L — AB (ref 3.5–5.1)
Potassium: 3.1 mmol/L — ABNORMAL LOW (ref 3.5–5.1)
SODIUM: 138 mmol/L (ref 135–145)
Sodium: 138 mmol/L (ref 135–145)
TOTAL PROTEIN: 6.4 g/dL — AB (ref 6.5–8.1)
Total Bilirubin: 0.4 mg/dL (ref 0.3–1.2)
Total Bilirubin: 0.7 mg/dL (ref 0.3–1.2)
Total Protein: 7.5 g/dL (ref 6.5–8.1)

## 2016-02-03 LAB — POCT I-STAT 3, ART BLOOD GAS (G3+)
Acid-base deficit: 7 mmol/L — ABNORMAL HIGH (ref 0.0–2.0)
Bicarbonate: 18.2 meq/L — ABNORMAL LOW (ref 20.0–24.0)
O2 SAT: 99 %
PCO2 ART: 35.1 mmHg (ref 35.0–45.0)
TCO2: 19 mmol/L (ref 0–100)
pH, Arterial: 7.322 — ABNORMAL LOW (ref 7.350–7.450)
pO2, Arterial: 166 mmHg — ABNORMAL HIGH (ref 80.0–100.0)

## 2016-02-03 LAB — POCT I-STAT 7, (LYTES, BLD GAS, ICA,H+H)
ACID-BASE DEFICIT: 7 mmol/L — AB (ref 0.0–2.0)
Acid-base deficit: 6 mmol/L — ABNORMAL HIGH (ref 0.0–2.0)
BICARBONATE: 18.9 meq/L — AB (ref 20.0–24.0)
Bicarbonate: 21.4 meq/L (ref 20.0–24.0)
CALCIUM ION: 1.13 mmol/L (ref 1.13–1.30)
Calcium, Ion: 1.04 mmol/L — ABNORMAL LOW (ref 1.13–1.30)
HCT: 24 % — ABNORMAL LOW (ref 39.0–52.0)
HEMATOCRIT: 33 % — AB (ref 39.0–52.0)
HEMOGLOBIN: 11.2 g/dL — AB (ref 13.0–17.0)
Hemoglobin: 8.2 g/dL — ABNORMAL LOW (ref 13.0–17.0)
O2 SAT: 100 %
O2 SAT: 100 %
PCO2 ART: 46.9 mmHg — AB (ref 35.0–45.0)
PO2 ART: 494 mmHg — AB (ref 80.0–100.0)
POTASSIUM: 3.7 mmol/L (ref 3.5–5.1)
Patient temperature: 35.9
Potassium: 3.5 mmol/L (ref 3.5–5.1)
SODIUM: 141 mmol/L (ref 135–145)
Sodium: 143 mmol/L (ref 135–145)
TCO2: 20 mmol/L (ref 0–100)
TCO2: 23 mmol/L (ref 0–100)
pCO2 arterial: 35.7 mmHg (ref 35.0–45.0)
pH, Arterial: 7.267 — ABNORMAL LOW (ref 7.350–7.450)
pH, Arterial: 7.327 — ABNORMAL LOW (ref 7.350–7.450)
pO2, Arterial: 540 mmHg — ABNORMAL HIGH (ref 80.0–100.0)

## 2016-02-03 LAB — URINE MICROSCOPIC-ADD ON

## 2016-02-03 LAB — PREPARE FRESH FROZEN PLASMA
UNIT DIVISION: 0
Unit division: 0

## 2016-02-03 LAB — URINALYSIS, ROUTINE W REFLEX MICROSCOPIC
BILIRUBIN URINE: NEGATIVE
GLUCOSE, UA: NEGATIVE mg/dL
KETONES UR: 15 mg/dL — AB
Nitrite: NEGATIVE
PH: 5.5 (ref 5.0–8.0)
PROTEIN: 100 mg/dL — AB
Specific Gravity, Urine: 1.03 (ref 1.005–1.030)

## 2016-02-03 LAB — ETHANOL: ALCOHOL ETHYL (B): 138 mg/dL — AB (ref <5)

## 2016-02-03 LAB — I-STAT CG4 LACTIC ACID, ED: Lactic Acid, Venous: 13.63 mmol/L (ref 0.5–1.9)

## 2016-02-03 LAB — ABO/RH: ABO/RH(D): O POS

## 2016-02-03 SURGERY — LAPAROTOMY, EXPLORATORY
Anesthesia: General | Site: Abdomen

## 2016-02-03 MED ORDER — HEMOSTATIC AGENTS (NO CHARGE) OPTIME
TOPICAL | Status: DC | PRN
  Administered 2016-02-03 (×2): 1

## 2016-02-03 MED ORDER — SODIUM CHLORIDE 0.9 % IV SOLN
INTRAVENOUS | Status: DC
  Administered 2016-02-03 – 2016-02-05 (×4): via INTRAVENOUS

## 2016-02-03 MED ORDER — LACTATED RINGERS IV SOLN
INTRAVENOUS | Status: DC | PRN
  Administered 2016-02-03 (×2): via INTRAVENOUS

## 2016-02-03 MED ORDER — PROPOFOL 1000 MG/100ML IV EMUL
0.0000 ug/kg/min | INTRAVENOUS | Status: DC
  Administered 2016-02-03 – 2016-02-04 (×4): 50 ug/kg/min via INTRAVENOUS
  Filled 2016-02-03 (×3): qty 100

## 2016-02-03 MED ORDER — PANTOPRAZOLE SODIUM 40 MG PO TBEC
40.0000 mg | DELAYED_RELEASE_TABLET | Freq: Two times a day (BID) | ORAL | Status: DC
  Administered 2016-02-06 – 2016-02-07 (×3): 40 mg via ORAL
  Filled 2016-02-03 (×4): qty 1

## 2016-02-03 MED ORDER — CHLORHEXIDINE GLUCONATE 0.12% ORAL RINSE (MEDLINE KIT)
15.0000 mL | Freq: Two times a day (BID) | OROMUCOSAL | Status: DC
  Administered 2016-02-03 – 2016-02-05 (×4): 15 mL via OROMUCOSAL

## 2016-02-03 MED ORDER — CEFAZOLIN IN D5W 1 GM/50ML IV SOLN
1.0000 g | Freq: Three times a day (TID) | INTRAVENOUS | Status: DC
  Administered 2016-02-03 – 2016-02-05 (×5): 1 g via INTRAVENOUS
  Filled 2016-02-03 (×8): qty 50

## 2016-02-03 MED ORDER — FENTANYL CITRATE (PF) 100 MCG/2ML IJ SOLN
INTRAMUSCULAR | Status: DC | PRN
  Administered 2016-02-03: 150 ug via INTRAVENOUS
  Administered 2016-02-03 (×2): 50 ug via INTRAVENOUS

## 2016-02-03 MED ORDER — ETOMIDATE 2 MG/ML IV SOLN
INTRAVENOUS | Status: DC | PRN
  Administered 2016-02-03: 18 mg via INTRAVENOUS

## 2016-02-03 MED ORDER — FENTANYL CITRATE (PF) 100 MCG/2ML IJ SOLN
50.0000 ug | Freq: Once | INTRAMUSCULAR | Status: AC
  Administered 2016-02-03: 50 ug via INTRAVENOUS

## 2016-02-03 MED ORDER — 0.9 % SODIUM CHLORIDE (POUR BTL) OPTIME
TOPICAL | Status: DC | PRN
  Administered 2016-02-03 (×2): 1000 mL

## 2016-02-03 MED ORDER — SUCCINYLCHOLINE 20MG/ML (10ML) SYRINGE FOR MEDFUSION PUMP - OPTIME
INTRAMUSCULAR | Status: DC | PRN
  Administered 2016-02-03: 100 mg via INTRAVENOUS

## 2016-02-03 MED ORDER — ANTISEPTIC ORAL RINSE SOLUTION (CORINZ)
7.0000 mL | Freq: Four times a day (QID) | OROMUCOSAL | Status: DC
  Administered 2016-02-04 – 2016-02-06 (×8): 7 mL via OROMUCOSAL

## 2016-02-03 MED ORDER — LIDOCAINE HCL (CARDIAC) 20 MG/ML IV SOLN
INTRAVENOUS | Status: DC | PRN
  Administered 2016-02-03: 60 mg via INTRAVENOUS

## 2016-02-03 MED ORDER — CEFAZOLIN SODIUM-DEXTROSE 2-3 GM-% IV SOLR
INTRAVENOUS | Status: DC | PRN
  Administered 2016-02-03: 2 g via INTRAVENOUS

## 2016-02-03 MED ORDER — FAMOTIDINE IN NACL 20-0.9 MG/50ML-% IV SOLN
20.0000 mg | Freq: Two times a day (BID) | INTRAVENOUS | Status: DC
  Administered 2016-02-03 – 2016-02-05 (×5): 20 mg via INTRAVENOUS
  Filled 2016-02-03 (×10): qty 50

## 2016-02-03 MED ORDER — ALBUMIN HUMAN 5 % IV SOLN
INTRAVENOUS | Status: DC | PRN
  Administered 2016-02-03 (×2): via INTRAVENOUS

## 2016-02-03 MED ORDER — FENTANYL BOLUS VIA INFUSION
50.0000 ug | INTRAVENOUS | Status: DC | PRN
  Filled 2016-02-03: qty 50

## 2016-02-03 MED ORDER — MIDAZOLAM HCL 5 MG/5ML IJ SOLN
INTRAMUSCULAR | Status: DC | PRN
  Administered 2016-02-03: 2 mg via INTRAVENOUS

## 2016-02-03 MED ORDER — SODIUM CHLORIDE 0.9 % IV SOLN
INTRAVENOUS | Status: DC | PRN
  Administered 2016-02-03: 2000 mL via INTRAVENOUS

## 2016-02-03 MED ORDER — FENTANYL CITRATE (PF) 2500 MCG/50ML IJ SOLN
25.0000 ug/h | INTRAMUSCULAR | Status: DC
  Administered 2016-02-03: 100 ug/h via INTRAVENOUS
  Administered 2016-02-04: 350 ug/h via INTRAVENOUS
  Filled 2016-02-03 (×2): qty 50

## 2016-02-03 MED ORDER — IOPAMIDOL (ISOVUE-300) INJECTION 61%
INTRAVENOUS | Status: AC
  Administered 2016-02-03: 100 mL
  Filled 2016-02-03: qty 100

## 2016-02-03 MED ORDER — ROCURONIUM BROMIDE 100 MG/10ML IV SOLN
INTRAVENOUS | Status: DC | PRN
  Administered 2016-02-03: 70 mg via INTRAVENOUS
  Administered 2016-02-03: 30 mg via INTRAVENOUS

## 2016-02-03 MED ORDER — FENTANYL CITRATE (PF) 100 MCG/2ML IJ SOLN
INTRAMUSCULAR | Status: AC
  Filled 2016-02-03: qty 2

## 2016-02-03 MED ORDER — SODIUM CHLORIDE 0.9 % IV SOLN
INTRAVENOUS | Status: DC | PRN
  Administered 2016-02-03 (×2): via INTRAVENOUS

## 2016-02-03 MED ORDER — PROPOFOL 500 MG/50ML IV EMUL
INTRAVENOUS | Status: DC | PRN
  Administered 2016-02-03: 50 ug/kg/min via INTRAVENOUS

## 2016-02-03 SURGICAL SUPPLY — 44 items
BLADE SURG ROTATE 9660 (MISCELLANEOUS) IMPLANT
CANISTER SUCTION 2500CC (MISCELLANEOUS) ×2 IMPLANT
CHLORAPREP W/TINT 26ML (MISCELLANEOUS) ×2 IMPLANT
COVER SURGICAL LIGHT HANDLE (MISCELLANEOUS) ×2 IMPLANT
DRAIN CHANNEL 19F RND (DRAIN) ×1 IMPLANT
DRAPE LAPAROSCOPIC ABDOMINAL (DRAPES) ×2 IMPLANT
DRAPE WARM FLUID 44X44 (DRAPE) ×2 IMPLANT
DRSG COVADERM 4X14 (GAUZE/BANDAGES/DRESSINGS) ×1 IMPLANT
DRSG OPSITE POSTOP 4X10 (GAUZE/BANDAGES/DRESSINGS) IMPLANT
DRSG OPSITE POSTOP 4X8 (GAUZE/BANDAGES/DRESSINGS) IMPLANT
ELECT BLADE 4.0 EZ CLEAN MEGAD (MISCELLANEOUS) ×2
ELECT BLADE 6.5 EXT (BLADE) IMPLANT
ELECT CAUTERY BLADE 6.4 (BLADE) ×4 IMPLANT
ELECT REM PT RETURN 9FT ADLT (ELECTROSURGICAL) ×2
ELECTRODE BLDE 4.0 EZ CLN MEGD (MISCELLANEOUS) IMPLANT
ELECTRODE REM PT RTRN 9FT ADLT (ELECTROSURGICAL) ×1 IMPLANT
EVACUATOR SILICONE 100CC (DRAIN) ×1 IMPLANT
GLOVE BIO SURGEON STRL SZ7 (GLOVE) ×2 IMPLANT
GLOVE BIOGEL PI IND STRL 7.5 (GLOVE) ×1 IMPLANT
GLOVE BIOGEL PI INDICATOR 7.5 (GLOVE) ×1
GOWN STRL REUS W/ TWL LRG LVL3 (GOWN DISPOSABLE) ×2 IMPLANT
GOWN STRL REUS W/TWL LRG LVL3 (GOWN DISPOSABLE) ×4
KIT BASIN OR (CUSTOM PROCEDURE TRAY) ×2 IMPLANT
KIT ROOM TURNOVER OR (KITS) ×2 IMPLANT
LIGASURE IMPACT 36 18CM CVD LR (INSTRUMENTS) ×1 IMPLANT
NS IRRIG 1000ML POUR BTL (IV SOLUTION) ×4 IMPLANT
PACK GENERAL/GYN (CUSTOM PROCEDURE TRAY) ×2 IMPLANT
PAD ARMBOARD 7.5X6 YLW CONV (MISCELLANEOUS) ×2 IMPLANT
SPECIMEN JAR LARGE (MISCELLANEOUS) IMPLANT
SPONGE GAUZE 4X4 12PLY STER LF (GAUZE/BANDAGES/DRESSINGS) ×1 IMPLANT
SPONGE LAP 18X18 X RAY DECT (DISPOSABLE) ×4 IMPLANT
STAPLER VISISTAT 35W (STAPLE) ×2 IMPLANT
SUCTION POOLE TIP (SUCTIONS) ×2 IMPLANT
SUT ETHILON 2 0 FS 18 (SUTURE) ×1 IMPLANT
SUT PDS AB 1 TP1 96 (SUTURE) ×4 IMPLANT
SUT SILK 2 0 SH CR/8 (SUTURE) ×2 IMPLANT
SUT SILK 2 0 TIES 10X30 (SUTURE) ×2 IMPLANT
SUT SILK 3 0 SH CR/8 (SUTURE) ×2 IMPLANT
SUT SILK 3 0 TIES 10X30 (SUTURE) ×2 IMPLANT
SUT VIC AB 3-0 SH 18 (SUTURE) IMPLANT
TAPE CLOTH SURG 4X10 WHT LF (GAUZE/BANDAGES/DRESSINGS) ×1 IMPLANT
TOWEL OR 17X26 10 PK STRL BLUE (TOWEL DISPOSABLE) ×2 IMPLANT
TRAY FOLEY CATH 16FRSI W/METER (SET/KITS/TRAYS/PACK) ×1 IMPLANT
YANKAUER SUCT BULB TIP NO VENT (SUCTIONS) IMPLANT

## 2016-02-03 NOTE — ED Notes (Signed)
Pt arrival.  
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
## 2016-02-03 NOTE — ED Notes (Addendum)
2nd unit O-neg started at 1950.  Ended at 301952.  Unit number [id number]
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
## 2016-02-03 NOTE — ED Notes (Addendum)
1 unit Oneg started at 1947, ended at 981949.  Unit [id number] [phone]

## 2016-02-03 NOTE — Progress Notes (Signed)
ABG collected  
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
## 2016-02-03 NOTE — Anesthesia Procedure Notes (Signed)
Procedure Name: Intubation Date/Time: 02/03/2016 8:31 PM Performed by: Arlice ColtMANESS, Demani Mcbrien B Pre-anesthesia Checklist: Patient identified, Emergency Drugs available, Suction available, Patient being monitored and Timeout performed Patient Re-evaluated:Patient Re-evaluated prior to inductionOxygen Delivery Method: Circle system utilized Preoxygenation: Pre-oxygenation with 100% oxygen Intubation Type: IV induction, Rapid sequence and Cricoid Pressure applied Laryngoscope Size: Mac and 3 Grade View: Grade I Tube type: Subglottic suction tube Tube size: 8.0 mm Number of attempts: 1 Airway Equipment and Method: Stylet Placement Confirmation: ETT inserted through vocal cords under direct vision,  positive ETCO2 and breath sounds checked- equal and bilateral Secured at: 23 cm Tube secured with: Tape Dental Injury: Teeth and Oropharynx as per pre-operative assessment
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
## 2016-02-03 NOTE — Progress Notes (Addendum)
Chaplain provided pastoral care and social support to mother, Edward Mora, who was agitated, upset, and crying. Mom reports patient has bipolar disorder and ADHD, which chaplain communicated to medical staff.  Mother stated "I need to be in there with him; when I'm not, that's when he gets upset." Chaplain explained protocol.  Mom preferred to stay outside ED entrance with other arriving friends/family rather than in family room.  Provided orientation to area and refreshments to family members. Checked in various times on mother and met patient sibling, a brother, Richardson Landry. Will escort family to [place] waiting area on 2nd floor when daughter arrives.    Chaplain returned to provide ongoing support to family. Escorted mother to 2nd floor OR waiting room. Mother was very teary and at times crying out loudly.  She stated that patient had just gotten out of prison yesterday following 9 months of incarceration.   Chaplain appreciated the support of [facility] staff member Sunday Spillers, from [place] waiting area who assisted patient's mother after her shift ended, including offering beautiful words of support and prayer.   Daughter had not arrived yet, but patient's girlfriend [place]) had arrived, and fell into arms of mother - both sobbing.    Please call as assistance is required.  Luana Shu [phone]    02/03/16 2000  Clinical Encounter Type  Visited With Family;Patient and family together  Visit Type Initial;Psychological support;Spiritual support;Critical Care  Referral From Physician  Consult/Referral To Chaplain  Spiritual Encounters  Spiritual Needs Prayer  Stress Factors  Family Stress Factors Lack of knowledge;Loss of control

## 2016-02-03 NOTE — OR Nursing (Addendum)
Dr. Berniece SalinesBenjamin Herrick- Urology Consultant observed surgery. He didn't scrub in during the surgery.

## 2016-02-03 NOTE — OR Nursing (Signed)
[facility] called @2125  at closing

## 2016-02-03 NOTE — Anesthesia Postprocedure Evaluation (Signed)
Anesthesia Post Note  Patient: Edward Mora  Procedure(s) Performed: Procedure(s) (LRB): EXPLORATORY LAPAROTOMY ,DRAINAGE LIVER INJURY (N/A)  Patient location during evaluation: SICU Anesthesia Type: General Level of consciousness: sedated Pain management: pain level controlled Vital Signs Assessment: post-procedure vital signs reviewed and stable Respiratory status: patient remains intubated per anesthesia plan Cardiovascular status: stable Postop Assessment: no signs of nausea or vomiting Anesthetic complications: no    Last Vitals:  Vitals:   02/03/16 2245 02/03/16 2306  BP:    Pulse: 84 81  Resp: 16 16  Temp:      Last Pain:  Vitals:   02/03/16 2013  TempSrc: Oral                 Reino KentJudd, Levone Otten J

## 2016-02-03 NOTE — ED Notes (Signed)
Pt returned from CT °

## 2016-02-03 NOTE — ED Notes (Signed)
Pt taken to CT.

## 2016-02-04 ENCOUNTER — Encounter (HOSPITAL_COMMUNITY): Payer: Self-pay | Admitting: Surgery

## 2016-02-04 ENCOUNTER — Inpatient Hospital Stay (HOSPITAL_COMMUNITY): Payer: Self-pay

## 2016-02-04 LAB — TYPE AND SCREEN
ABO/RH(D): O POS
ANTIBODY SCREEN: NEGATIVE
UNIT DIVISION: 0
UNIT DIVISION: 0
UNIT DIVISION: 0
UNIT DIVISION: 0

## 2016-02-04 LAB — BASIC METABOLIC PANEL
Anion gap: 3 — ABNORMAL LOW (ref 5–15)
BUN: 11 mg/dL (ref 6–20)
CALCIUM: 7.9 mg/dL — AB (ref 8.9–10.3)
CHLORIDE: 110 mmol/L (ref 101–111)
CO2: 24 mmol/L (ref 22–32)
CREATININE: 1.23 mg/dL (ref 0.61–1.24)
GFR calc Af Amer: >60 mL/min (ref >60)
GFR calc non Af Amer: >60 mL/min (ref >60)
Glucose, Bld: 111 mg/dL — ABNORMAL HIGH (ref 65–99)
Potassium: 3.5 mmol/L (ref 3.5–5.1)
SODIUM: 137 mmol/L (ref 135–145)

## 2016-02-04 LAB — MRSA PCR SCREENING: MRSA BY PCR: NEGATIVE

## 2016-02-04 LAB — PROTIME-INR
INR: 1.37
PROTHROMBIN TIME: 17 s — AB (ref 11.4–15.2)

## 2016-02-04 LAB — BLOOD PRODUCT ORDER (VERBAL) VERIFICATION

## 2016-02-04 MED ORDER — ACETAMINOPHEN 650 MG RE SUPP: 650.0000 mg | RECTAL | Status: DC | PRN

## 2016-02-04 MED ORDER — DIPHENHYDRAMINE HCL 50 MG/ML IJ SOLN
12.5000 mg | Freq: Four times a day (QID) | INTRAMUSCULAR | Status: DC | PRN
Start: 2016-02-04 — End: 2016-02-06
  Administered 2016-02-04 – 2016-02-05 (×3): 12.5 mg via INTRAVENOUS
  Filled 2016-02-04 (×4): qty 1

## 2016-02-04 MED ORDER — DIPHENHYDRAMINE HCL 12.5 MG/5ML PO ELIX
12.5000 mg | ORAL_SOLUTION | Freq: Four times a day (QID) | ORAL | Status: DC | PRN
  Administered 2016-02-06: 12.5 mg via ORAL
  Filled 2016-02-04: qty 10

## 2016-02-04 MED ORDER — ONDANSETRON HCL 4 MG/2ML IJ SOLN: 4.0000 mg | Freq: Four times a day (QID) | INTRAMUSCULAR | Status: DC | PRN

## 2016-02-04 MED ORDER — ACETAMINOPHEN 160 MG/5ML PO SOLN
650.0000 mg | ORAL | Status: DC | PRN
  Administered 2016-02-04 – 2016-02-06 (×4): 650 mg via ORAL
  Filled 2016-02-04 (×4): qty 20.3

## 2016-02-04 MED ORDER — HYDROMORPHONE 1 MG/ML IV SOLN
INTRAVENOUS | Status: DC
  Administered 2016-02-04: 1.4 mg via INTRAVENOUS
  Administered 2016-02-04: 3.6 mg via INTRAVENOUS
  Administered 2016-02-04: 15:00:00 via INTRAVENOUS
  Administered 2016-02-05: 3.3 mg via INTRAVENOUS
  Administered 2016-02-05: 3.6 mg via INTRAVENOUS
  Administered 2016-02-05: 2.7 mg via INTRAVENOUS
  Filled 2016-02-04: qty 25

## 2016-02-04 MED ORDER — NALOXONE HCL 0.4 MG/ML IJ SOLN: 0.4000 mg | INTRAMUSCULAR | Status: DC | PRN

## 2016-02-04 MED ORDER — SODIUM CHLORIDE 0.9% FLUSH: 9.0000 mL | INTRAVENOUS | Status: DC | PRN

## 2016-02-04 NOTE — Procedures (Signed)
Extubation Procedure Note  Patient Details:   Name: Salvadore Farberhillip S XXXsimpson DOB: 01/19/1986 MRN: [id number]   Airway Documentation:  + air leak test prior to extubation.   Evaluation  O2 sats: stable throughout Complications: No apparent complications Patient did tolerate procedure well. Bilateral Breath Sounds: Clear, Diminished   Yes pt able to speak.  No stridor noted, no distress noted.   Jennette KettleBrowning, Aldina Porta Joy 02/04/2016, 12:13 PM
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
## 2016-02-04 NOTE — Anesthesia Procedure Notes (Signed)
Central Venous Catheter Insertion Performed by: anesthesiologist Patient location: OR. Preanesthetic checklist: patient identified, IV checked, site marked, risks and benefits discussed, surgical consent, monitors and equipment checked, pre-op evaluation, timeout performed and anesthesia consent Landmarks identified Catheter size: 8.5 Fr Sheath introducer Procedure performed using ultrasound guided technique. Attempts: 1 Following insertion, line sutured and dressing applied. Post procedure assessment: blood return through all ports, free fluid flow and no air. Patient tolerated the procedure well with no immediate complications.

## 2016-02-04 NOTE — Progress Notes (Signed)
   02/04/16 1054  Clinical Encounter Type  Visited With Family;Health care provider  Visit Type Initial;Critical Care;Trauma  Referral From [facility]   Unit chaplain met with patient's mother. Chaplain offered prayer and support.Chaplain introduced spiritual care services. Spiritual care services available as needed.   Jeri Lager, Chaplain 02/04/16 10:55 AM
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
## 2016-02-04 NOTE — Addendum Note (Signed)
Addendum  created 02/04/16 0847 by Karlyne GreenspanBenjamin Teniqua Marron, MD   Anesthesia Intra Blocks edited, Child order released for a procedure order, LDA created via procedure documentation, Sign clinical note

## 2016-02-04 NOTE — Progress Notes (Signed)
Initiated wean per MD order.  Pt appears to be tol well so far,  RN aware.

## 2016-02-04 NOTE — Progress Notes (Signed)
Restraints removed after extubation at 1200. Discontinuation of restraints were put in late. Will continue to monitor.

## 2016-02-05 LAB — CBC WITH DIFFERENTIAL/PLATELET
BASOS PCT: 0 %
Basophils Absolute: 0 10*3/uL (ref 0.0–0.1)
Eosinophils Absolute: 0.1 10*3/uL (ref 0.0–0.7)
Eosinophils Relative: 1 %
HEMATOCRIT: 32.2 % — AB (ref 39.0–52.0)
HEMOGLOBIN: 10 g/dL — AB (ref 13.0–17.0)
LYMPHS ABS: 1.8 10*3/uL (ref 0.7–4.0)
LYMPHS PCT: 17 %
MCH: 27.5 pg (ref 26.0–34.0)
MCHC: 31.1 g/dL (ref 30.0–36.0)
MCV: 88.7 fL (ref 78.0–100.0)
MONO ABS: 1.2 10*3/uL — AB (ref 0.1–1.0)
MONOS PCT: 11 %
NEUTROS ABS: 7.5 10*3/uL (ref 1.7–7.7)
NEUTROS PCT: 71 %
Platelets: 244 10*3/uL (ref 150–400)
RBC: 3.63 MIL/uL — ABNORMAL LOW (ref 4.22–5.81)
RDW: 14.6 % (ref 11.5–15.5)
WBC: 10.5 10*3/uL (ref 4.0–10.5)

## 2016-02-05 MED ORDER — MORPHINE SULFATE (PF) 2 MG/ML IV SOLN
2.0000 mg | INTRAVENOUS | Status: DC | PRN
  Administered 2016-02-05: 2 mg via INTRAVENOUS
  Administered 2016-02-05 (×2): 4 mg via INTRAVENOUS
  Administered 2016-02-05: 2 mg via INTRAVENOUS
  Administered 2016-02-06 (×2): 4 mg via INTRAVENOUS
  Filled 2016-02-05 (×3): qty 2
  Filled 2016-02-05 (×2): qty 1
  Filled 2016-02-05: qty 2

## 2016-02-05 NOTE — Progress Notes (Signed)
Pt asking numerous questions such as when his drain will come out, when he can eat and so forth. Also enquiring if he will be able to take his zoloft and tegretol while he is here.
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
## 2016-02-05 NOTE — Progress Notes (Signed)
Pt transferred from stepdown. Alert, oriented and able to voice needs. Medicated for abdominal pain as ordered
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
## 2016-02-05 NOTE — Progress Notes (Signed)
 4mg  iv morphine administered for c/o abdominal pain. Pt reports passing gas this evening

## 2016-02-05 NOTE — Progress Notes (Signed)
10 Mg of Dilaudid wasted in sink with Londell MohKaty Walton, RN.
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
## 2016-02-06 ENCOUNTER — Inpatient Hospital Stay (HOSPITAL_COMMUNITY): Payer: Self-pay

## 2016-02-06 DIAGNOSIS — S37001A Unspecified injury of right kidney, initial encounter: Secondary | ICD-10-CM | POA: Diagnosis present

## 2016-02-06 DIAGNOSIS — S36119A Unspecified injury of liver, initial encounter: Secondary | ICD-10-CM | POA: Diagnosis present

## 2016-02-06 DIAGNOSIS — D62 Acute posthemorrhagic anemia: Secondary | ICD-10-CM | POA: Diagnosis not present

## 2016-02-06 LAB — CBC
HCT: 32.4 % — ABNORMAL LOW (ref 39.0–52.0)
HEMOGLOBIN: 10.2 g/dL — AB (ref 13.0–17.0)
MCH: 28.1 pg (ref 26.0–34.0)
MCHC: 31.5 g/dL (ref 30.0–36.0)
MCV: 89.3 fL (ref 78.0–100.0)
PLATELETS: 258 10*3/uL (ref 150–400)
RBC: 3.63 MIL/uL — AB (ref 4.22–5.81)
RDW: 14.2 % (ref 11.5–15.5)
WBC: 11.6 10*3/uL — ABNORMAL HIGH (ref 4.0–10.5)

## 2016-02-06 LAB — BASIC METABOLIC PANEL
Anion gap: 8 (ref 5–15)
BUN: <5 mg/dL — ABNORMAL LOW (ref 6–20)
CHLORIDE: 107 mmol/L (ref 101–111)
CO2: 26 mmol/L (ref 22–32)
CREATININE: 1.18 mg/dL (ref 0.61–1.24)
Calcium: 8.6 mg/dL — ABNORMAL LOW (ref 8.9–10.3)
Glucose, Bld: 106 mg/dL — ABNORMAL HIGH (ref 65–99)
POTASSIUM: 3.5 mmol/L (ref 3.5–5.1)
SODIUM: 141 mmol/L (ref 135–145)

## 2016-02-06 MED ORDER — POLYETHYLENE GLYCOL 3350 17 G PO PACK
17.0000 g | PACK | Freq: Every day | ORAL | Status: DC
  Administered 2016-02-06 – 2016-02-07 (×2): 17 g via ORAL
  Filled 2016-02-06 (×2): qty 1

## 2016-02-06 MED ORDER — MORPHINE SULFATE (PF) 2 MG/ML IV SOLN: 2.0000 mg | INTRAVENOUS | Status: DC | PRN

## 2016-02-06 MED ORDER — OXYCODONE HCL 5 MG PO TABS
5.0000 mg | ORAL_TABLET | ORAL | Status: DC | PRN
  Administered 2016-02-06: 10 mg via ORAL
  Administered 2016-02-06: 15 mg via ORAL
  Administered 2016-02-06: 10 mg via ORAL
  Administered 2016-02-06 – 2016-02-07 (×3): 15 mg via ORAL
  Filled 2016-02-06: qty 2
  Filled 2016-02-06 (×2): qty 3
  Filled 2016-02-06: qty 2
  Filled 2016-02-06 (×2): qty 3

## 2016-02-06 MED ORDER — DOCUSATE SODIUM 100 MG PO CAPS
100.0000 mg | ORAL_CAPSULE | Freq: Two times a day (BID) | ORAL | Status: DC
  Administered 2016-02-06 – 2016-02-07 (×2): 100 mg via ORAL
  Filled 2016-02-06 (×3): qty 1

## 2016-02-06 MED ORDER — BOOST / RESOURCE BREEZE PO LIQD
1.0000 | Freq: Three times a day (TID) | ORAL | Status: DC
  Administered 2016-02-06: 1 via ORAL

## 2016-02-06 MED ORDER — DIPHENHYDRAMINE HCL 25 MG PO CAPS
25.0000 mg | ORAL_CAPSULE | Freq: Four times a day (QID) | ORAL | Status: DC | PRN
  Administered 2016-02-06 – 2016-02-07 (×3): 25 mg via ORAL
  Filled 2016-02-06 (×3): qty 1
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
## 2016-02-06 NOTE — Progress Notes (Signed)
Pt having a temp. Of 103 orally. Encourage to use the incentive spirometer( can do 500-750). Helped to get out of bed and ambulate but as soon as he stepped out of the door he requested to go back to bed. Dr Magnus IvanBlackman notified, said to order port. Chest xray in AM and to give tylenol.
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
## 2016-02-07 MED ORDER — OXYCODONE HCL 5 MG PO TABS: 5.0000 mg | ORAL_TABLET | ORAL | 0 refills | Status: DC | PRN

## 2016-02-07 NOTE — Progress Notes (Signed)
This RN trying to teach how to and the purpose of incentive spirometer. Encouraging to use it but pt said "nothing wrong with my lungs!". Pt only able to do 250-52800ml. Refused to do it again and got irritated. Will continue to encourage again.

## 2016-02-07 NOTE — Progress Notes (Signed)
Discharge home. Home discharge instruction given, no question verbalized.JP out , tolerated well.

## 2016-02-09 ENCOUNTER — Encounter (HOSPITAL_COMMUNITY): Payer: Self-pay | Admitting: *Deleted

## 2016-02-10 ENCOUNTER — Telehealth (HOSPITAL_COMMUNITY): Payer: Self-pay

## 2016-02-10 NOTE — Telephone Encounter (Signed)
Scheduled appt for 8/23
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
## 2016-10-07 ENCOUNTER — Emergency Department (HOSPITAL_COMMUNITY): Payer: Self-pay | Admitting: Certified Registered Nurse Anesthetist

## 2016-10-07 ENCOUNTER — Emergency Department (HOSPITAL_COMMUNITY): Payer: Self-pay

## 2016-10-07 ENCOUNTER — Emergency Department (HOSPITAL_COMMUNITY)
Admission: EM | Admit: 2016-10-07 | Discharge: 2016-10-07 | Disposition: A | Discharge status: Home / Self Care | Payer: Self-pay | Attending: Otolaryngology | Admitting: Otolaryngology

## 2016-10-07 ENCOUNTER — Encounter (HOSPITAL_COMMUNITY): Payer: Self-pay | Admitting: Emergency Medicine

## 2016-10-07 ENCOUNTER — Encounter (HOSPITAL_COMMUNITY)
Admission: EM | Disposition: A | Discharge status: Home / Self Care | Payer: Self-pay | Source: Home / Self Care | Attending: Emergency Medicine

## 2016-10-07 DIAGNOSIS — S02652A Fracture of angle of left mandible, initial encounter for closed fracture: Secondary | ICD-10-CM | POA: Insufficient documentation

## 2016-10-07 DIAGNOSIS — Z79899 Other long term (current) drug therapy: Secondary | ICD-10-CM | POA: Insufficient documentation

## 2016-10-07 DIAGNOSIS — Z9889 Other specified postprocedural states: Secondary | ICD-10-CM | POA: Insufficient documentation

## 2016-10-07 DIAGNOSIS — S0181XA Laceration without foreign body of other part of head, initial encounter: Secondary | ICD-10-CM | POA: Insufficient documentation

## 2016-10-07 DIAGNOSIS — F209 Schizophrenia, unspecified: Secondary | ICD-10-CM | POA: Insufficient documentation

## 2016-10-07 DIAGNOSIS — S022XXA Fracture of nasal bones, initial encounter for closed fracture: Secondary | ICD-10-CM | POA: Insufficient documentation

## 2016-10-07 DIAGNOSIS — S0219XA Other fracture of base of skull, initial encounter for closed fracture: Secondary | ICD-10-CM | POA: Insufficient documentation

## 2016-10-07 DIAGNOSIS — S0285XA Fracture of orbit, unspecified, initial encounter for closed fracture: Secondary | ICD-10-CM

## 2016-10-07 DIAGNOSIS — S0003XA Contusion of scalp, initial encounter: Secondary | ICD-10-CM | POA: Insufficient documentation

## 2016-10-07 DIAGNOSIS — S02609A Fracture of mandible, unspecified, initial encounter for closed fracture: Secondary | ICD-10-CM

## 2016-10-07 DIAGNOSIS — S0232XA Fracture of orbital floor, left side, initial encounter for closed fracture: Secondary | ICD-10-CM | POA: Insufficient documentation

## 2016-10-07 DIAGNOSIS — T797XXA Traumatic subcutaneous emphysema, initial encounter: Secondary | ICD-10-CM | POA: Insufficient documentation

## 2016-10-07 DIAGNOSIS — F319 Bipolar disorder, unspecified: Secondary | ICD-10-CM | POA: Insufficient documentation

## 2016-10-07 DIAGNOSIS — K08419 Partial loss of teeth due to trauma, unspecified class: Secondary | ICD-10-CM | POA: Insufficient documentation

## 2016-10-07 HISTORY — DX: Fracture of mandible, unspecified, initial encounter for closed fracture: S02.609A

## 2016-10-07 HISTORY — PX: FACIAL LACERATION REPAIR: SHX6589

## 2016-10-07 HISTORY — PX: ORIF MANDIBULAR FRACTURE: SHX2127

## 2016-10-07 SURGERY — OPEN REDUCTION INTERNAL FIXATION (ORIF) MANDIBULAR FRACTURE
Anesthesia: General | Site: Mouth | Laterality: Left

## 2016-10-07 MED ORDER — PROPOFOL 10 MG/ML IV BOLUS
INTRAVENOUS | Status: AC
  Filled 2016-10-07: qty 20

## 2016-10-07 MED ORDER — LIDOCAINE-EPINEPHRINE 1 %-1:100000 IJ SOLN
INTRAMUSCULAR | Status: DC | PRN
  Administered 2016-10-07: 10 mL

## 2016-10-07 MED ORDER — OXYMETAZOLINE HCL 0.05 % NA SOLN
NASAL | Status: DC | PRN
  Administered 2016-10-07: 1 via NASAL

## 2016-10-07 MED ORDER — FENTANYL CITRATE (PF) 100 MCG/2ML IJ SOLN
INTRAMUSCULAR | Status: DC | PRN
  Administered 2016-10-07: 50 ug via INTRAVENOUS
  Administered 2016-10-07: 100 ug via INTRAVENOUS

## 2016-10-07 MED ORDER — OXYMETAZOLINE HCL 0.05 % NA SOLN
NASAL | Status: AC
  Filled 2016-10-07: qty 15

## 2016-10-07 MED ORDER — LIDOCAINE 2% (20 MG/ML) 5 ML SYRINGE
INTRAMUSCULAR | Status: AC
  Filled 2016-10-07: qty 5

## 2016-10-07 MED ORDER — ARTIFICIAL TEARS OP OINT
TOPICAL_OINTMENT | OPHTHALMIC | Status: AC
  Filled 2016-10-07: qty 3.5

## 2016-10-07 MED ORDER — FENTANYL CITRATE (PF) 250 MCG/5ML IJ SOLN
INTRAMUSCULAR | Status: AC
  Filled 2016-10-07: qty 5

## 2016-10-07 MED ORDER — SUCCINYLCHOLINE CHLORIDE 20 MG/ML IJ SOLN
INTRAMUSCULAR | Status: DC | PRN
  Administered 2016-10-07: 100 mg via INTRAVENOUS

## 2016-10-07 MED ORDER — LIDOCAINE HCL (CARDIAC) 20 MG/ML IV SOLN
INTRAVENOUS | Status: DC | PRN
  Administered 2016-10-07: 60 mg via INTRAVENOUS

## 2016-10-07 MED ORDER — DEXMEDETOMIDINE HCL 200 MCG/2ML IV SOLN
INTRAVENOUS | Status: DC | PRN
  Administered 2016-10-07: 12 ug via INTRAVENOUS
  Administered 2016-10-07: 8 ug via INTRAVENOUS

## 2016-10-07 MED ORDER — CLINDAMYCIN PHOSPHATE 900 MG/50ML IV SOLN
900.0000 mg | Freq: Once | INTRAVENOUS | Status: AC
  Administered 2016-10-07: 900 mg via INTRAVENOUS

## 2016-10-07 MED ORDER — SUGAMMADEX SODIUM 200 MG/2ML IV SOLN
INTRAVENOUS | Status: DC | PRN
  Administered 2016-10-07: 150 mg via INTRAVENOUS

## 2016-10-07 MED ORDER — MIDAZOLAM HCL 2 MG/2ML IJ SOLN
INTRAMUSCULAR | Status: AC
  Filled 2016-10-07: qty 2

## 2016-10-07 MED ORDER — BSS IO SOLN
INTRAOCULAR | Status: AC
  Filled 2016-10-07: qty 30

## 2016-10-07 MED ORDER — CLINDAMYCIN PHOSPHATE 900 MG/50ML IV SOLN
INTRAVENOUS | Status: AC
  Filled 2016-10-07: qty 50

## 2016-10-07 MED ORDER — PROPOFOL 10 MG/ML IV BOLUS
INTRAVENOUS | Status: DC | PRN
  Administered 2016-10-07: 100 mg via INTRAVENOUS
  Administered 2016-10-07: 200 mg via INTRAVENOUS

## 2016-10-07 MED ORDER — ROCURONIUM BROMIDE 100 MG/10ML IV SOLN
INTRAVENOUS | Status: DC | PRN
  Administered 2016-10-07: 30 mg via INTRAVENOUS

## 2016-10-07 MED ORDER — SUGAMMADEX SODIUM 200 MG/2ML IV SOLN
INTRAVENOUS | Status: AC
  Filled 2016-10-07: qty 2

## 2016-10-07 MED ORDER — HYDROCODONE-ACETAMINOPHEN 5-325 MG PO TABS
1.0000 | ORAL_TABLET | Freq: Once | ORAL | Status: DC
  Filled 2016-10-07: qty 1

## 2016-10-07 MED ORDER — HYDROMORPHONE HCL 1 MG/ML IJ SOLN
INTRAMUSCULAR | Status: AC
  Filled 2016-10-07: qty 0.5

## 2016-10-07 MED ORDER — TETANUS-DIPHTH-ACELL PERTUSSIS 5-2.5-18.5 LF-MCG/0.5 IM SUSP
0.5000 mL | Freq: Once | INTRAMUSCULAR | Status: DC
  Filled 2016-10-07: qty 0.5

## 2016-10-07 MED ORDER — HYDROCODONE-ACETAMINOPHEN 7.5-325 MG/15ML PO SOLN: 15.0000 mL | Freq: Four times a day (QID) | ORAL | 0 refills | Status: DC | PRN

## 2016-10-07 MED ORDER — ONDANSETRON HCL 4 MG/2ML IJ SOLN
INTRAMUSCULAR | Status: DC | PRN
  Administered 2016-10-07: 4 mg via INTRAVENOUS

## 2016-10-07 MED ORDER — FENTANYL CITRATE (PF) 100 MCG/2ML IJ SOLN
50.0000 ug | Freq: Once | INTRAMUSCULAR | Status: AC
  Administered 2016-10-07: 50 ug via INTRAVENOUS
  Filled 2016-10-07: qty 2

## 2016-10-07 MED ORDER — MIDAZOLAM HCL 5 MG/5ML IJ SOLN
INTRAMUSCULAR | Status: DC | PRN
  Administered 2016-10-07: 2 mg via INTRAVENOUS

## 2016-10-07 MED ORDER — CHLORHEXIDINE GLUCONATE 0.12 % MT SOLN: OROMUCOSAL | 0 refills | Status: DC

## 2016-10-07 MED ORDER — HYDROMORPHONE HCL 1 MG/ML IJ SOLN
0.2500 mg | INTRAMUSCULAR | Status: DC | PRN
  Administered 2016-10-07: 0.5 mg via INTRAVENOUS

## 2016-10-07 MED ORDER — DEXAMETHASONE SODIUM PHOSPHATE 10 MG/ML IJ SOLN
INTRAMUSCULAR | Status: DC | PRN
  Administered 2016-10-07: 10 mg via INTRAVENOUS

## 2016-10-07 MED ORDER — AMOXICILLIN 400 MG/5ML PO SUSR: 800.0000 mg | Freq: Two times a day (BID) | ORAL | 0 refills | Status: AC

## 2016-10-07 MED ORDER — SUCCINYLCHOLINE CHLORIDE 200 MG/10ML IV SOSY
PREFILLED_SYRINGE | INTRAVENOUS | Status: AC
  Filled 2016-10-07: qty 10

## 2016-10-07 MED ORDER — LIDOCAINE-EPINEPHRINE 1 %-1:100000 IJ SOLN
INTRAMUSCULAR | Status: AC
  Filled 2016-10-07: qty 1

## 2016-10-07 MED ORDER — LACTATED RINGERS IV SOLN
INTRAVENOUS | Status: DC
  Administered 2016-10-07 (×2): via INTRAVENOUS

## 2016-10-07 MED ORDER — DEXMEDETOMIDINE HCL IN NACL 200 MCG/50ML IV SOLN
INTRAVENOUS | Status: AC
  Filled 2016-10-07: qty 50

## 2016-10-07 MED ORDER — LIDOCAINE HCL 2 % EX GEL
CUTANEOUS | Status: DC | PRN
  Administered 2016-10-07: 1 via TOPICAL

## 2016-10-07 MED ORDER — ROCURONIUM BROMIDE 10 MG/ML (PF) SYRINGE
PREFILLED_SYRINGE | INTRAVENOUS | Status: AC
  Filled 2016-10-07: qty 5

## 2016-10-07 SURGICAL SUPPLY — 28 items
BAG DECANTER FOR FLEXI CONT (MISCELLANEOUS) IMPLANT
BLADE SURG 15 STRL LF DISP TIS (BLADE) IMPLANT
BLADE SURG 15 STRL SS (BLADE) ×4
CANISTER SUCT 3000ML PPV (MISCELLANEOUS) ×4 IMPLANT
CLEANER TIP ELECTROSURG 2X2 (MISCELLANEOUS) ×4 IMPLANT
DRAPE HALF SHEET 40X57 (DRAPES) ×2 IMPLANT
ELECT COATED BLADE 2.86 ST (ELECTRODE) ×2 IMPLANT
ELECT NDL BLADE 2-5/6 (NEEDLE) IMPLANT
ELECT NEEDLE BLADE 2-5/6 (NEEDLE) ×4 IMPLANT
ELECT REM PT RETURN 9FT ADLT (ELECTROSURGICAL) ×4
ELECTRODE REM PT RTRN 9FT ADLT (ELECTROSURGICAL) ×2 IMPLANT
GLOVE ECLIPSE 7.5 STRL STRAW (GLOVE) ×4 IMPLANT
GOWN STRL REUS W/ TWL LRG LVL3 (GOWN DISPOSABLE) ×4 IMPLANT
GOWN STRL REUS W/TWL LRG LVL3 (GOWN DISPOSABLE) ×8
KIT BASIN OR (CUSTOM PROCEDURE TRAY) ×4 IMPLANT
KIT ROOM TURNOVER OR (KITS) ×4 IMPLANT
NDL HYPO 25GX1X1/2 BEV (NEEDLE) IMPLANT
NEEDLE HYPO 25GX1X1/2 BEV (NEEDLE) ×4 IMPLANT
NS IRRIG 1000ML POUR BTL (IV SOLUTION) ×4 IMPLANT
PAD ARMBOARD 7.5X6 YLW CONV (MISCELLANEOUS) ×8 IMPLANT
PENCIL BUTTON HOLSTER BLD 10FT (ELECTRODE) ×4 IMPLANT
SCISSORS WIRE ANG 4 3/4 DISP (INSTRUMENTS) IMPLANT
SCREW UPPER FACE 2.0X8MM (Screw) ×8 IMPLANT
SUT VIC AB 3-0 FS2 27 (SUTURE) IMPLANT
TOOTHBRUSH ADULT (PERSONAL CARE ITEMS) ×4 IMPLANT
TOWEL OR 17X24 6PK STRL BLUE (TOWEL DISPOSABLE) ×4 IMPLANT
TRAY ENT MC OR (CUSTOM PROCEDURE TRAY) ×4 IMPLANT
WATER STERILE IRR 1000ML POUR (IV SOLUTION) IMPLANT
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
## 2016-10-07 NOTE — Transfer of Care (Signed)
Immediate Anesthesia Transfer of Care Note  Patient: Edward Mora Xxxsimpson  Procedure(s) Performed: Procedure(s): OPEN REDUCTION INTERNAL FIXATION (ORIF) MANDIBULAR FRACTURE (Left) FACIAL LACERATION REPAIR (Left)  Patient Location: PACU  Anesthesia Type:General  Level of Consciousness: awake, alert  and responds to stimulation  Airway & Oxygen Therapy: Patient Spontanous Breathing and Patient connected to face mask oxygen  Post-op Assessment: Report given to RN and Post -op Vital signs reviewed and stable  Post vital signs: Reviewed and stable  Last Vitals:  Vitals:   10/07/16 0713 10/07/16 0718  BP:  (!) 139/91  Pulse: 64 72  Resp:  18  Temp:      Last Pain:  Vitals:   10/07/16 0722  TempSrc:   PainSc: 10-Worst pain ever         Complications: No apparent anesthesia complications

## 2016-10-07 NOTE — ED Notes (Signed)
Brother's phone number [phone].

## 2016-10-07 NOTE — Discharge Instructions (Signed)
Fractured-Jaw Meal Plan The purpose of the fractured-jaw meal plan is to provide foods that can be easily blended and easily swallowed. This plan is typically used after jaw or mouth surgery, wired jaw surgery, or dental surgery. Foods in this plan need to be blended so that they can be sipped from a straw or given through a syringe. You should try to have at least three meals and three snacks daily. It is important to make sure you get enough calories and protein to prevent weight loss and help your body heal, especially after surgery. You may wish to include a liquid multivitamin in your plan to ensure that you get all the vitamins and minerals you need. Ask your health care provider for a recommendation. How do I prepare my meals? All foods in this plan must be blended. Avoid nuts, seeds, skins, peels, bones, or any foods that cannot be blended to the right consistency. Make sure to eat a variety of foods from each food group every day. The following tips can help you as you blend your food:  Remove skins, seeds, and peels from food.  Cook meats and vegetables thoroughly.  Cut foods into small pieces and mix with a small amount of liquid in a food processor or blender. Continue to add liquid until the food becomes thin enough to sip through a straw.  Adding liquids such as juice, milk, cream, broth, gravy, or vegetable juice can help add flavor to foods.  Heat foods after they have been blended to reduce the amount of foam created from blending.  Heat or cool your foods to lukewarm temperatures if your teeth and mouth are sensitive to extreme temperatures. What foods can I eat? Make sure to eat a variety of foods from each food group. Grains   Hot cereals, such as oatmeal, grits, ground wheat cereals, and polenta.  Rice and pasta.  Couscous. Vegetables   All cooked or canned vegetables, without seeds and skins.  Vegetable juices.  Cooked potatoes, without skins. Fruit   Any  cooked or canned fruits, without seeds and skins.  Fresh, peeled soft fruits, such as bananas and peaches, that can be blended until smooth.  All fruit juices, without seeds and skins. Meat and Other Protein Sources   Soft-boiled eggs, scrambled eggs, powdered eggs, pasteurized egg mixtures, and custard.  Ground meats, such as hamburger, [place], sausage, and meatloaf.  Tender, well-cooked meat, poultry, and fish prepared without bones or skin.  Soft soy foods (such as tofu).  Smooth nut butters. Dairy   All are allowed. Beverages   Coffee (regular or decaffeinated), tea, and mineral water. Condiments   All seasonings and condiments that blend well. When may I need to supplement my meals? If you begin to lose weight on this plan, you may need to increase the amount of food you are eating or the number of calories in your food or both. You can increase the number of calories by adding any of the following foods:  Protein powder or powdered milk.  Extra fats, such as margarine (without trans fat), sour cream, cream cheese, cream, and nut butters, such as peanut butter or almond butter.  Sweets, such as honey, ice cream, blackstrap molasses, or sugar. This information is not intended to replace advice given to you by your health care provider. Make sure you discuss any questions you have with your health care provider. Document Released: 11/25/2009 Document Revised: 11/13/2015 Document Reviewed: 05/04/2013 Elsevier Interactive Patient Education  2017 [employer].  ------------------------  Mandibular Fracture A mandibular fracture is a break in the jawbone. Follow these instructions at home:  If told, apply ice to the injured area:  Put ice in a plastic bag.  Place a towel between your skin and the bag.  Leave the ice on for 20 minutes, 2-3 times per day.  Take over-the-counter and prescription medicines only as told by your doctor.  Follow your doctor's  instructions about diet. You may need to eat only soft foods or liquid foods. Make sure to get enough protein and other nutrients in your diet.  Sleep on your back. This makes it so that you do not put pressure on your jaw.  Try not to exercise so hard that you get short of breath.   Get help right away if:  You have a fever.  You have trouble breathing.  You feel like your airway is tight.  You cannot swallow your spit (saliva). This information is not intended to replace advice given to you by your health care provider. Make sure you discuss any questions you have with your health care provider. Document Released: 08/30/2011 Document Revised: 07/10/2015 Document Reviewed: 03/02/2015 Elsevier Interactive Patient Education  2017 [employer].

## 2016-10-07 NOTE — ED Notes (Signed)
Pt ambulated to restroom from room, tolerated well. Pt placed into gown and on monitor.

## 2016-10-07 NOTE — ED Notes (Signed)
Pt refusing to have his vital signs monitored.
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
## 2016-10-07 NOTE — Anesthesia Preprocedure Evaluation (Addendum)
Anesthesia Evaluation  Patient identified by MRN, date of birth, ID band Patient awake    Reviewed: Allergy & Precautions, H&P , NPO status , Patient's Chart, lab work & pertinent test results  Airway Mallampati: III   Neck ROM: Full  Mouth opening: Limited Mouth Opening  Dental no notable dental hx. (+) Teeth Intact, Dental Advisory Given   Pulmonary neg pulmonary ROS,    Pulmonary exam normal breath sounds clear to auscultation       Cardiovascular negative cardio ROS   Rhythm:Regular Rate:Normal     Neuro/Psych Bipolar Disorder negative neurological ROS  negative psych ROS   GI/Hepatic negative GI ROS, Neg liver ROS,   Endo/Other  negative endocrine ROS  Renal/GU negative Renal ROS  negative genitourinary   Musculoskeletal   Abdominal   Peds  Hematology negative hematology ROS (+)   Anesthesia Other Findings   Reproductive/Obstetrics negative OB ROS                            Anesthesia Physical Anesthesia Plan  ASA: II  Anesthesia Plan: General   Post-op Pain Management:    Induction: Intravenous  Airway Management Planned: Nasal ETT and Video Laryngoscope Planned  Additional Equipment:   Intra-op Plan:   Post-operative Plan: Extubation in OR  Informed Consent: I have reviewed the patients History and Physical, chart, labs and discussed the procedure including the risks, benefits and alternatives for the proposed anesthesia with the patient or authorized representative who has indicated his/her understanding and acceptance.   Dental advisory given  Plan Discussed with: CRNA  Anesthesia Plan Comments:        Anesthesia Quick Evaluation
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
## 2016-10-07 NOTE — Progress Notes (Signed)
wirecutters at bedside

## 2016-10-07 NOTE — Anesthesia Postprocedure Evaluation (Signed)
Anesthesia Post Note  Patient: Bernd Crom Xxxsimpson  Procedure(s) Performed: Procedure(s) (LRB): OPEN REDUCTION INTERNAL FIXATION (ORIF) MANDIBULAR FRACTURE (Left) FACIAL LACERATION REPAIR (Left)  Patient location during evaluation: PACU Anesthesia Type: General Level of consciousness: awake and alert Pain management: pain level controlled Vital Signs Assessment: post-procedure vital signs reviewed and stable Respiratory status: spontaneous breathing, nonlabored ventilation and respiratory function stable Cardiovascular status: blood pressure returned to baseline and stable Postop Assessment: no signs of nausea or vomiting Anesthetic complications: no       Last Vitals:  Vitals:   10/07/16 1030 10/07/16 1042  BP: 126/84 124/82  Pulse: 76 74  Resp: 16 16  Temp: 36.9 C 36.7 C    Last Pain:  Vitals:   10/07/16 1045  TempSrc:   PainSc: 4                  Annais Crafts,W. EDMOND

## 2016-10-07 NOTE — ED Notes (Signed)
Per OR pt surgery is at 0830  Pt refusing to allow staff to start IV. Pt NPO. Pt refusing tetanus shot.

## 2016-10-08 ENCOUNTER — Encounter (HOSPITAL_COMMUNITY): Payer: Self-pay | Admitting: Otolaryngology

## 2016-10-27 ENCOUNTER — Other Ambulatory Visit: Payer: Self-pay | Admitting: Otolaryngology

## 2016-11-02 ENCOUNTER — Encounter (HOSPITAL_BASED_OUTPATIENT_CLINIC_OR_DEPARTMENT_OTHER): Payer: Self-pay | Admitting: *Deleted

## 2016-11-08 ENCOUNTER — Ambulatory Visit (HOSPITAL_BASED_OUTPATIENT_CLINIC_OR_DEPARTMENT_OTHER)
Admission: RE | Admit: 2016-11-08 | Discharge: 2016-11-08 | Disposition: A | Discharge status: Home / Self Care | Payer: Self-pay | Source: Ambulatory Visit | Attending: Otolaryngology | Admitting: Otolaryngology

## 2016-11-08 ENCOUNTER — Ambulatory Visit (HOSPITAL_BASED_OUTPATIENT_CLINIC_OR_DEPARTMENT_OTHER): Payer: Self-pay | Admitting: Anesthesiology

## 2016-11-08 ENCOUNTER — Encounter (HOSPITAL_BASED_OUTPATIENT_CLINIC_OR_DEPARTMENT_OTHER)
Admission: RE | Disposition: A | Discharge status: Home / Self Care | Payer: Self-pay | Source: Ambulatory Visit | Attending: Otolaryngology

## 2016-11-08 ENCOUNTER — Encounter (HOSPITAL_BASED_OUTPATIENT_CLINIC_OR_DEPARTMENT_OTHER): Payer: Self-pay

## 2016-11-08 DIAGNOSIS — F17200 Nicotine dependence, unspecified, uncomplicated: Secondary | ICD-10-CM | POA: Insufficient documentation

## 2016-11-08 DIAGNOSIS — S02609A Fracture of mandible, unspecified, initial encounter for closed fracture: Secondary | ICD-10-CM | POA: Insufficient documentation

## 2016-11-08 DIAGNOSIS — F319 Bipolar disorder, unspecified: Secondary | ICD-10-CM | POA: Insufficient documentation

## 2016-11-08 HISTORY — PX: MANDIBULAR HARDWARE REMOVAL: SHX5205

## 2016-11-08 HISTORY — DX: Fracture of mandible, unspecified, initial encounter for closed fracture: S02.609A

## 2016-11-08 SURGERY — REMOVAL, HARDWARE, MANDIBLE
Anesthesia: General

## 2016-11-08 MED ORDER — OXYCODONE HCL 5 MG PO TABS: 5.0000 mg | ORAL_TABLET | Freq: Once | ORAL | Status: DC | PRN

## 2016-11-08 MED ORDER — ONDANSETRON HCL 4 MG/2ML IJ SOLN
INTRAMUSCULAR | Status: AC
  Filled 2016-11-08: qty 2

## 2016-11-08 MED ORDER — OXYCODONE HCL 5 MG/5ML PO SOLN
5.0000 mg | Freq: Once | ORAL | Status: DC | PRN
Start: 2016-11-08 — End: 2016-11-08

## 2016-11-08 MED ORDER — PROPOFOL 10 MG/ML IV BOLUS
INTRAVENOUS | Status: DC | PRN
  Administered 2016-11-08 (×4): 20 mg via INTRAVENOUS

## 2016-11-08 MED ORDER — PROMETHAZINE HCL 25 MG/ML IJ SOLN: 6.2500 mg | INTRAMUSCULAR | Status: DC | PRN

## 2016-11-08 MED ORDER — PROPOFOL 10 MG/ML IV BOLUS
INTRAVENOUS | Status: AC
  Filled 2016-11-08: qty 20

## 2016-11-08 MED ORDER — FENTANYL CITRATE (PF) 100 MCG/2ML IJ SOLN
INTRAMUSCULAR | Status: AC
  Filled 2016-11-08: qty 2

## 2016-11-08 MED ORDER — MIDAZOLAM HCL 2 MG/2ML IJ SOLN
1.0000 mg | INTRAMUSCULAR | Status: DC | PRN
  Administered 2016-11-08: 2 mg via INTRAVENOUS

## 2016-11-08 MED ORDER — LACTATED RINGERS IV SOLN: INTRAVENOUS | Status: DC

## 2016-11-08 MED ORDER — ONDANSETRON HCL 4 MG/2ML IJ SOLN
INTRAMUSCULAR | Status: DC | PRN
  Administered 2016-11-08: 4 mg via INTRAVENOUS

## 2016-11-08 MED ORDER — SCOPOLAMINE 1 MG/3DAYS TD PT72: 1.0000 | MEDICATED_PATCH | Freq: Once | TRANSDERMAL | Status: DC | PRN

## 2016-11-08 MED ORDER — LIDOCAINE-EPINEPHRINE 1 %-1:100000 IJ SOLN
INTRAMUSCULAR | Status: DC | PRN
  Administered 2016-11-08: 2 mL

## 2016-11-08 MED ORDER — FENTANYL CITRATE (PF) 100 MCG/2ML IJ SOLN
25.0000 ug | INTRAMUSCULAR | Status: DC | PRN
  Administered 2016-11-08: 50 ug via INTRAVENOUS

## 2016-11-08 MED ORDER — MEPERIDINE HCL 25 MG/ML IJ SOLN: 6.2500 mg | INTRAMUSCULAR | Status: DC | PRN

## 2016-11-08 MED ORDER — MIDAZOLAM HCL 2 MG/2ML IJ SOLN
INTRAMUSCULAR | Status: AC
  Filled 2016-11-08: qty 2

## 2016-11-08 MED ORDER — LACTATED RINGERS IV SOLN
INTRAVENOUS | Status: DC
  Administered 2016-11-08: 09:00:00 via INTRAVENOUS

## 2016-11-08 MED ORDER — FENTANYL CITRATE (PF) 100 MCG/2ML IJ SOLN
50.0000 ug | INTRAMUSCULAR | Status: DC | PRN
  Administered 2016-11-08: 100 ug via INTRAVENOUS

## 2016-11-08 MED ORDER — BACITRACIN-NEOMYCIN-POLYMYXIN 400-5-5000 EX OINT
TOPICAL_OINTMENT | CUTANEOUS | Status: DC | PRN
  Administered 2016-11-08: 1 via TOPICAL

## 2016-11-08 SURGICAL SUPPLY — 31 items
BLADE SURG 15 STRL LF DISP TIS (BLADE) ×1 IMPLANT
BLADE SURG 15 STRL SS (BLADE) ×3
CANISTER SUCT 1200ML W/VALVE (MISCELLANEOUS) ×3 IMPLANT
COVER MAYO STAND STRL (DRAPES) ×3 IMPLANT
DECANTER SPIKE VIAL GLASS SM (MISCELLANEOUS) ×3 IMPLANT
ELECT COATED BLADE 2.86 ST (ELECTRODE) ×3 IMPLANT
ELECT REM PT RETURN 9FT ADLT (ELECTROSURGICAL) ×3
ELECTRODE REM PT RTRN 9FT ADLT (ELECTROSURGICAL) ×1 IMPLANT
GAUZE SPONGE 4X4 12PLY STRL LF (GAUZE/BANDAGES/DRESSINGS) ×6 IMPLANT
GAUZE SPONGE 4X4 16PLY XRAY LF (GAUZE/BANDAGES/DRESSINGS) IMPLANT
GLOVE BIO SURGEON STRL SZ7 (GLOVE) ×2 IMPLANT
GLOVE BIO SURGEON STRL SZ7.5 (GLOVE) ×3 IMPLANT
GOWN STRL REUS W/ TWL LRG LVL3 (GOWN DISPOSABLE) ×2 IMPLANT
GOWN STRL REUS W/TWL LRG LVL3 (GOWN DISPOSABLE) ×6
MARKER SKIN DUAL TIP RULER LAB (MISCELLANEOUS) IMPLANT
NDL PRECISIONGLIDE 27X1.5 (NEEDLE) ×1 IMPLANT
NEEDLE PRECISIONGLIDE 27X1.5 (NEEDLE) ×3 IMPLANT
NS IRRIG 1000ML POUR BTL (IV SOLUTION) ×3 IMPLANT
PACK BASIN DAY SURGERY FS (CUSTOM PROCEDURE TRAY) ×3 IMPLANT
PENCIL BUTTON HOLSTER BLD 10FT (ELECTRODE) ×3 IMPLANT
SCISSORS WIRE ANG 4 3/4 DISP (INSTRUMENTS) IMPLANT
SHEET MEDIUM DRAPE 40X70 STRL (DRAPES) ×3 IMPLANT
SUT CHROMIC 3 0 PS 2 (SUTURE) IMPLANT
SUT CHROMIC 4 0 PS 2 18 (SUTURE) IMPLANT
SUT CHROMIC 4 0 RB 1X27 (SUTURE) IMPLANT
SYR CONTROL 10ML LL (SYRINGE) ×3 IMPLANT
TOWEL OR 17X24 6PK STRL BLUE (TOWEL DISPOSABLE) ×3 IMPLANT
TRAY DSU PREP LF (CUSTOM PROCEDURE TRAY) IMPLANT
TUBE CONNECTING 20'X1/4 (TUBING) ×1
TUBE CONNECTING 20X1/4 (TUBING) ×2 IMPLANT
YANKAUER SUCT BULB TIP NO VENT (SUCTIONS) IMPLANT

## 2016-11-08 NOTE — Transfer of Care (Signed)
Immediate Anesthesia Transfer of Care Note  Patient: Edward Mora  Procedure(s) Performed: Procedure(s): MANDIBULAR HARDWARE REMOVAL (N/A)  Patient Location: PACU  Anesthesia Type:MAC  Level of Consciousness: awake, sedated and patient cooperative  Airway & Oxygen Therapy: Patient Spontanous Breathing and Patient connected to nasal cannula oxygen  Post-op Assessment: Report given to RN and Post -op Vital signs reviewed and stable  Post vital signs: Reviewed and stable  Last Vitals:  Vitals:   11/08/16 0829  BP: 119/80  Pulse: (!) 54  Temp: 36.5 C    Last Pain:  Vitals:   11/08/16 0829  TempSrc: Oral         Complications: No apparent anesthesia complications

## 2016-11-08 NOTE — Anesthesia Preprocedure Evaluation (Signed)
Anesthesia Evaluation  Patient identified by MRN, date of birth, ID band Patient awake    Reviewed: Allergy & Precautions, NPO status , Patient's Chart, lab work & pertinent test results  Airway      Mouth opening: Limited Mouth Opening  Dental no notable dental hx.    Pulmonary Current Smoker,    Pulmonary exam normal        Cardiovascular negative cardio ROS   Rhythm:Regular Rate:Normal     Neuro/Psych PSYCHIATRIC DISORDERS Bipolar Disorder negative neurological ROS     GI/Hepatic negative GI ROS, Neg liver ROS,   Endo/Other  negative endocrine ROS  Renal/GU   negative genitourinary   Musculoskeletal negative musculoskeletal ROS (+)   Abdominal   Peds negative pediatric ROS (+)  Hematology negative hematology ROS (+)   Anesthesia Other Findings   Reproductive/Obstetrics negative OB ROS                             Anesthesia Physical Anesthesia Plan  ASA: II  Anesthesia Plan: General   Post-op Pain Management:    Induction: Intravenous  Airway Management Planned: Mask and Natural Airway  Additional Equipment:   Intra-op Plan:   Post-operative Plan:   Informed Consent: I have reviewed the patients History and Physical, chart, labs and discussed the procedure including the risks, benefits and alternatives for the proposed anesthesia with the patient or authorized representative who has indicated his/her understanding and acceptance.     Plan Discussed with: CRNA  Anesthesia Plan Comments:         Anesthesia Quick Evaluation
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
## 2016-11-08 NOTE — Anesthesia Postprocedure Evaluation (Addendum)
Anesthesia Post Note  Patient: Edward Mora  Procedure(s) Performed: Procedure(s) (LRB): MANDIBULAR HARDWARE REMOVAL (N/A)  Patient location during evaluation: PACU Anesthesia Type: General Level of consciousness: awake and alert Pain management: pain level controlled Vital Signs Assessment: post-procedure vital signs reviewed and stable Respiratory status: spontaneous breathing, nonlabored ventilation, respiratory function stable and patient connected to nasal cannula oxygen Cardiovascular status: blood pressure returned to baseline and stable Postop Assessment: no signs of nausea or vomiting Anesthetic complications: no       Last Vitals:  Vitals:   11/08/16 1147 11/08/16 1240  BP:  109/62  Pulse: (!) 51 (!) 44  Resp: 15 18  Temp:  36.9 C    Last Pain:  Vitals:   11/08/16 1240  TempSrc:   PainSc: 3                  Shelton SilvasKevin D Earlie Arciga

## 2016-11-08 NOTE — Discharge Instructions (Addendum)
The patient may resume his previous activities and diet. He may follow-up in my office on a when necessary basis.    Post Anesthesia Home Care Instructions  Activity: Get plenty of rest for the remainder of the day. A responsible individual must stay with you for 24 hours following the procedure.  For the next 24 hours, DO NOT: -Drive a car -Advertising copywriterperate machinery -Drink alcoholic beverages -Take any medication unless instructed by your physician -Make any legal decisions or sign important papers.  Meals: Start with liquid foods such as gelatin or soup. Progress to regular foods as tolerated. Avoid greasy, spicy, heavy foods. If nausea and/or vomiting occur, drink only clear liquids until the nausea and/or vomiting subsides. Call your physician if vomiting continues.  Special Instructions/Symptoms: Your throat may feel dry or sore from the anesthesia or the breathing tube placed in your throat during surgery. If this causes discomfort, gargle with warm salt water. The discomfort should disappear within 24 hours.  If you had a scopolamine patch placed behind your ear for the management of post- operative nausea and/or vomiting:  1. The medication in the patch is effective for 72 hours, after which it should be removed.  Wrap patch in a tissue and discard in the trash. Wash hands thoroughly with soap and water. 2. You may remove the patch earlier than 72 hours if you experience unpleasant side effects which may include dry mouth, dizziness or visual disturbances. 3. Avoid touching the patch. Wash your hands with soap and water after contact with the patch.

## 2016-11-09 ENCOUNTER — Encounter (HOSPITAL_BASED_OUTPATIENT_CLINIC_OR_DEPARTMENT_OTHER): Payer: Self-pay | Admitting: Otolaryngology

## 2016-11-19 NOTE — Addendum Note (Signed)
Addendum  created 11/19/16 1255 by Angeligue Bowne D, MD   Sign clinical note    

## 2016-11-30 ENCOUNTER — Emergency Department (HOSPITAL_COMMUNITY)
Admission: EM | Admit: 2016-11-30 | Discharge: 2016-11-30 | Disposition: A | Discharge status: Home / Self Care | Payer: No Typology Code available for payment source | Attending: Emergency Medicine | Admitting: Emergency Medicine

## 2016-11-30 ENCOUNTER — Encounter (HOSPITAL_COMMUNITY): Payer: Self-pay | Admitting: Emergency Medicine

## 2016-11-30 ENCOUNTER — Emergency Department (HOSPITAL_COMMUNITY): Payer: No Typology Code available for payment source

## 2016-11-30 DIAGNOSIS — S90511A Abrasion, right ankle, initial encounter: Secondary | ICD-10-CM | POA: Diagnosis not present

## 2016-11-30 DIAGNOSIS — F17210 Nicotine dependence, cigarettes, uncomplicated: Secondary | ICD-10-CM | POA: Insufficient documentation

## 2016-11-30 DIAGNOSIS — Y92410 Unspecified street and highway as the place of occurrence of the external cause: Secondary | ICD-10-CM | POA: Diagnosis not present

## 2016-11-30 DIAGNOSIS — Z79899 Other long term (current) drug therapy: Secondary | ICD-10-CM | POA: Diagnosis not present

## 2016-11-30 DIAGNOSIS — Y939 Activity, unspecified: Secondary | ICD-10-CM | POA: Diagnosis not present

## 2016-11-30 DIAGNOSIS — S90512A Abrasion, left ankle, initial encounter: Secondary | ICD-10-CM | POA: Diagnosis not present

## 2016-11-30 DIAGNOSIS — Y999 Unspecified external cause status: Secondary | ICD-10-CM | POA: Diagnosis not present

## 2016-11-30 DIAGNOSIS — S82302A Unspecified fracture of lower end of left tibia, initial encounter for closed fracture: Secondary | ICD-10-CM

## 2016-11-30 DIAGNOSIS — S82392A Other fracture of lower end of left tibia, initial encounter for closed fracture: Secondary | ICD-10-CM | POA: Diagnosis not present

## 2016-11-30 DIAGNOSIS — S99912A Unspecified injury of left ankle, initial encounter: Secondary | ICD-10-CM | POA: Diagnosis present

## 2016-11-30 DIAGNOSIS — T07XXXA Unspecified multiple injuries, initial encounter: Secondary | ICD-10-CM

## 2016-11-30 MED ORDER — IBUPROFEN 200 MG PO TABS
600.0000 mg | ORAL_TABLET | Freq: Once | ORAL | Status: AC
  Administered 2016-11-30: 600 mg via ORAL
  Filled 2016-11-30: qty 3

## 2016-11-30 MED ORDER — NAPROXEN 500 MG PO TABS: 500.0000 mg | ORAL_TABLET | Freq: Two times a day (BID) | ORAL | 0 refills | Status: DC
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
## 2017-03-12 ENCOUNTER — Emergency Department (HOSPITAL_COMMUNITY)
Admission: EM | Admit: 2017-03-12 | Discharge: 2017-03-12 | Disposition: A | Discharge status: Home / Self Care | Payer: Self-pay | Attending: Emergency Medicine | Admitting: Emergency Medicine

## 2017-03-12 ENCOUNTER — Encounter (HOSPITAL_COMMUNITY): Payer: Self-pay | Admitting: Registered Nurse

## 2017-03-12 DIAGNOSIS — F1910 Other psychoactive substance abuse, uncomplicated: Secondary | ICD-10-CM

## 2017-03-12 DIAGNOSIS — R4587 Impulsiveness: Secondary | ICD-10-CM

## 2017-03-12 DIAGNOSIS — F17210 Nicotine dependence, cigarettes, uncomplicated: Secondary | ICD-10-CM | POA: Insufficient documentation

## 2017-03-12 DIAGNOSIS — F3164 Bipolar disorder, current episode mixed, severe, with psychotic features: Secondary | ICD-10-CM

## 2017-03-12 DIAGNOSIS — Z046 Encounter for general psychiatric examination, requested by authority: Secondary | ICD-10-CM | POA: Insufficient documentation

## 2017-03-12 DIAGNOSIS — F1994 Other psychoactive substance use, unspecified with psychoactive substance-induced mood disorder: Secondary | ICD-10-CM | POA: Diagnosis present

## 2017-03-12 DIAGNOSIS — R456 Violent behavior: Secondary | ICD-10-CM | POA: Insufficient documentation

## 2017-03-12 DIAGNOSIS — F1920 Other psychoactive substance dependence, uncomplicated: Secondary | ICD-10-CM | POA: Diagnosis present

## 2017-03-12 LAB — CBC
HEMATOCRIT: 38.8 % — AB (ref 39.0–52.0)
HEMOGLOBIN: 12.7 g/dL — AB (ref 13.0–17.0)
MCH: 28.6 pg (ref 26.0–34.0)
MCHC: 32.7 g/dL (ref 30.0–36.0)
MCV: 87.4 fL (ref 78.0–100.0)
Platelets: 452 10*3/uL — ABNORMAL HIGH (ref 150–400)
RBC: 4.44 MIL/uL (ref 4.22–5.81)
RDW: 14 % (ref 11.5–15.5)
WBC: 8.1 10*3/uL (ref 4.0–10.5)

## 2017-03-12 LAB — COMPREHENSIVE METABOLIC PANEL
ALT: 27 U/L (ref 17–63)
ANION GAP: 12 (ref 5–15)
AST: 35 U/L (ref 15–41)
Albumin: 4 g/dL (ref 3.5–5.0)
Alkaline Phosphatase: 63 U/L (ref 38–126)
BILIRUBIN TOTAL: 0.3 mg/dL (ref 0.3–1.2)
BUN: 10 mg/dL (ref 6–20)
CHLORIDE: 104 mmol/L (ref 101–111)
CO2: 24 mmol/L (ref 22–32)
Calcium: 9.1 mg/dL (ref 8.9–10.3)
Creatinine, Ser: 1.16 mg/dL (ref 0.61–1.24)
Glucose, Bld: 131 mg/dL — ABNORMAL HIGH (ref 65–99)
POTASSIUM: 3.6 mmol/L (ref 3.5–5.1)
Sodium: 140 mmol/L (ref 135–145)
TOTAL PROTEIN: 8 g/dL (ref 6.5–8.1)

## 2017-03-12 LAB — ETHANOL: Alcohol, Ethyl (B): 119 mg/dL — ABNORMAL HIGH (ref <5)

## 2017-03-12 LAB — RAPID URINE DRUG SCREEN, HOSP PERFORMED
Amphetamines: NOT DETECTED
Barbiturates: NOT DETECTED
Benzodiazepines: NOT DETECTED
Cocaine: POSITIVE — AB
Opiates: NOT DETECTED
Tetrahydrocannabinol: POSITIVE — AB

## 2017-03-12 MED ORDER — GABAPENTIN 100 MG PO CAPS: 200.0000 mg | ORAL_CAPSULE | Freq: Two times a day (BID) | ORAL | 0 refills | Status: DC

## 2017-03-12 MED ORDER — GABAPENTIN 100 MG PO CAPS
200.0000 mg | ORAL_CAPSULE | Freq: Two times a day (BID) | ORAL | Status: DC
  Administered 2017-03-12: 200 mg via ORAL
  Filled 2017-03-12 (×2): qty 2
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
## 2017-03-12 NOTE — ED Notes (Signed)
On the phone, pt is aware that he is ready for dc.
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
## 2017-03-12 NOTE — ED Notes (Signed)
Patient refused for me to collect labs °
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
## 2017-03-12 NOTE — BH Assessment (Signed)
[facility] Assessment Progress Note  Nira Conn, NP recommends inpt treatment. EDP Cathren Laine, MD notified and in agreement. EDP agrees to IVC pt if he attempts to elope. Reita Cliche, RN aware of disposition.   Princess Bruins, MSW, LCSWA TTS Specialist [phone]

## 2017-03-12 NOTE — ED Notes (Addendum)
Written dc instructions and prescription reviewed w/ patient. Pt encouraged to take medications as directed and follow up with [facility] on Monday as scheduled.  Pt also encouraged to avoid alcohol/drugs and seek treatment for suicidal/homicidal thoughts or urges.  Pt verbalized understanding and reported he would. Bus pass given

## 2017-03-12 NOTE — ED Notes (Signed)
Unable to collect labs patient  Is talking with TTS

## 2017-03-12 NOTE — ED Notes (Signed)
Pt angry, upset that he has not been dc'd yet.  Support given, verified with pt that he is being dc'd.  Pt able to calm down.
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
## 2017-03-12 NOTE — ED Notes (Addendum)
Pt ambulatory w/o difficulty to discharge area w/ mHt, belongings returned after leaving the area.

## 2017-03-12 NOTE — BHH Counselor (Addendum)
Writer provides pt with outpatient resource list of MH outpatient agencies in the area. Pt asked if he was being discharged with meds. Per pt's RN Wille Celeste, pt will be d/c w/ Rx for neurotin.  Evette Cristal, [place] Therapeutic Triage Specialist

## 2017-03-12 NOTE — ED Notes (Signed)
On the phone, snack given
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
## 2017-12-01 ENCOUNTER — Emergency Department (HOSPITAL_COMMUNITY)
Admission: EM | Admit: 2017-12-01 | Discharge: 2017-12-02 | Disposition: A | Discharge status: Home / Self Care | Payer: Self-pay | Attending: Emergency Medicine | Admitting: Emergency Medicine

## 2017-12-01 ENCOUNTER — Encounter (HOSPITAL_COMMUNITY): Payer: Self-pay

## 2017-12-01 ENCOUNTER — Other Ambulatory Visit: Payer: Self-pay

## 2017-12-01 DIAGNOSIS — Y9389 Activity, other specified: Secondary | ICD-10-CM | POA: Insufficient documentation

## 2017-12-01 DIAGNOSIS — S01112A Laceration without foreign body of left eyelid and periocular area, initial encounter: Secondary | ICD-10-CM | POA: Insufficient documentation

## 2017-12-01 DIAGNOSIS — F319 Bipolar disorder, unspecified: Secondary | ICD-10-CM | POA: Insufficient documentation

## 2017-12-01 DIAGNOSIS — S0181XA Laceration without foreign body of other part of head, initial encounter: Secondary | ICD-10-CM

## 2017-12-01 DIAGNOSIS — Z23 Encounter for immunization: Secondary | ICD-10-CM | POA: Insufficient documentation

## 2017-12-01 DIAGNOSIS — Z79899 Other long term (current) drug therapy: Secondary | ICD-10-CM | POA: Insufficient documentation

## 2017-12-01 DIAGNOSIS — S0990XA Unspecified injury of head, initial encounter: Secondary | ICD-10-CM | POA: Insufficient documentation

## 2017-12-01 DIAGNOSIS — F17210 Nicotine dependence, cigarettes, uncomplicated: Secondary | ICD-10-CM | POA: Insufficient documentation

## 2017-12-01 DIAGNOSIS — S01511A Laceration without foreign body of lip, initial encounter: Secondary | ICD-10-CM | POA: Insufficient documentation

## 2017-12-01 DIAGNOSIS — Y929 Unspecified place or not applicable: Secondary | ICD-10-CM | POA: Insufficient documentation

## 2017-12-01 DIAGNOSIS — Y998 Other external cause status: Secondary | ICD-10-CM | POA: Insufficient documentation
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
## 2017-12-02 ENCOUNTER — Emergency Department (HOSPITAL_COMMUNITY): Payer: Self-pay

## 2017-12-02 MED ORDER — OXYCODONE-ACETAMINOPHEN 5-325 MG PO TABS
1.0000 | ORAL_TABLET | Freq: Once | ORAL | Status: AC
  Administered 2017-12-02: 1 via ORAL
  Filled 2017-12-02: qty 1

## 2017-12-02 MED ORDER — TETANUS-DIPHTH-ACELL PERTUSSIS 5-2.5-18.5 LF-MCG/0.5 IM SUSP
0.5000 mL | Freq: Once | INTRAMUSCULAR | Status: AC
  Administered 2017-12-02: 0.5 mL via INTRAMUSCULAR
  Filled 2017-12-02: qty 0.5

## 2017-12-02 MED ORDER — LIDOCAINE HCL (PF) 1 % IJ SOLN
30.0000 mL | Freq: Once | INTRAMUSCULAR | Status: AC
  Administered 2017-12-02: 30 mL
  Filled 2017-12-02: qty 30

## 2017-12-02 NOTE — Discharge Instructions (Addendum)
Images reassuring.  Motrin and Tylenol for pain.  Apply ice to the affected area to help with swelling.  WOUND CARE Your stitches will dissolve.  Keep area clean and dry for 24 hours. Do not remove bandage, if applied.  After 24 hours, remove bandage and wash wound gently with mild soap and warm water. Reapply a new bandage after cleaning wound, if directed.  Continue daily cleansing with soap and water until stitches/staples are removed.  Do not apply any ointments or creams to the wound while stitches/staples are in place, as this may cause delayed healing.  Seek medical careif you experience any of the following signs of infection: Swelling, redness, pus drainage, streaking, fever >101.0 F  Seek care if you experience excessive bleeding that does not stop after 15-20 minutes of constant, firm pressure.
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
## 2018-09-29 ENCOUNTER — Other Ambulatory Visit: Payer: Self-pay

## 2018-09-29 ENCOUNTER — Inpatient Hospital Stay (HOSPITAL_COMMUNITY)
Admission: RE | Admit: 2018-09-29 | Discharge: 2018-10-01 | DRG: 885 | Disposition: A | Discharge status: Home / Self Care | Payer: No Typology Code available for payment source | Attending: Psychiatry | Admitting: Psychiatry

## 2018-09-29 ENCOUNTER — Encounter (HOSPITAL_COMMUNITY): Payer: Self-pay

## 2018-09-29 DIAGNOSIS — Z79899 Other long term (current) drug therapy: Secondary | ICD-10-CM

## 2018-09-29 DIAGNOSIS — R45850 Homicidal ideations: Secondary | ICD-10-CM | POA: Diagnosis present

## 2018-09-29 DIAGNOSIS — F1290 Cannabis use, unspecified, uncomplicated: Secondary | ICD-10-CM | POA: Diagnosis present

## 2018-09-29 DIAGNOSIS — F332 Major depressive disorder, recurrent severe without psychotic features: Secondary | ICD-10-CM | POA: Diagnosis present

## 2018-09-29 DIAGNOSIS — R45851 Suicidal ideations: Secondary | ICD-10-CM | POA: Diagnosis present

## 2018-09-29 DIAGNOSIS — G4700 Insomnia, unspecified: Secondary | ICD-10-CM | POA: Diagnosis present

## 2018-09-29 DIAGNOSIS — F17210 Nicotine dependence, cigarettes, uncomplicated: Secondary | ICD-10-CM | POA: Diagnosis present

## 2018-09-29 DIAGNOSIS — F10932 Alcohol use, unspecified with withdrawal with perceptual disturbance: Secondary | ICD-10-CM | POA: Insufficient documentation

## 2018-09-29 DIAGNOSIS — F314 Bipolar disorder, current episode depressed, severe, without psychotic features: Secondary | ICD-10-CM

## 2018-09-29 DIAGNOSIS — F419 Anxiety disorder, unspecified: Secondary | ICD-10-CM | POA: Diagnosis present

## 2018-09-29 DIAGNOSIS — R739 Hyperglycemia, unspecified: Secondary | ICD-10-CM | POA: Diagnosis present

## 2018-09-29 DIAGNOSIS — F10232 Alcohol dependence with withdrawal with perceptual disturbance: Secondary | ICD-10-CM | POA: Diagnosis present

## 2018-09-29 DIAGNOSIS — Z590 Homelessness: Secondary | ICD-10-CM

## 2018-09-29 DIAGNOSIS — F1024 Alcohol dependence with alcohol-induced mood disorder: Secondary | ICD-10-CM | POA: Diagnosis present

## 2018-09-29 DIAGNOSIS — F319 Bipolar disorder, unspecified: Secondary | ICD-10-CM | POA: Diagnosis present

## 2018-09-29 DIAGNOSIS — F1920 Other psychoactive substance dependence, uncomplicated: Secondary | ICD-10-CM | POA: Diagnosis present

## 2018-09-29 DIAGNOSIS — F3113 Bipolar disorder, current episode manic without psychotic features, severe: Secondary | ICD-10-CM

## 2018-09-29 LAB — CBC
HCT: 43.4 % (ref 39.0–52.0)
Hemoglobin: 13.3 g/dL (ref 13.0–17.0)
MCH: 29.2 pg (ref 26.0–34.0)
MCHC: 30.6 g/dL (ref 30.0–36.0)
MCV: 95.4 fL (ref 80.0–100.0)
Platelets: 455 10*3/uL — ABNORMAL HIGH (ref 150–400)
RBC: 4.55 MIL/uL (ref 4.22–5.81)
RDW: 14.7 % (ref 11.5–15.5)
WBC: 6 10*3/uL (ref 4.0–10.5)
nRBC: 0 % (ref 0.0–0.2)

## 2018-09-29 LAB — COMPREHENSIVE METABOLIC PANEL
ALT: 36 U/L (ref 0–44)
AST: 30 U/L (ref 15–41)
Albumin: 4.3 g/dL (ref 3.5–5.0)
Alkaline Phosphatase: 72 U/L (ref 38–126)
Anion gap: 10 (ref 5–15)
BUN: 13 mg/dL (ref 6–20)
CO2: 25 mmol/L (ref 22–32)
Calcium: 9.5 mg/dL (ref 8.9–10.3)
Chloride: 104 mmol/L (ref 98–111)
Creatinine, Ser: 1.19 mg/dL (ref 0.61–1.24)
GFR calc Af Amer: >60 mL/min (ref >60)
GFR calc non Af Amer: >60 mL/min (ref >60)
Glucose, Bld: 135 mg/dL — ABNORMAL HIGH (ref 70–99)
Potassium: 3.6 mmol/L (ref 3.5–5.1)
Sodium: 139 mmol/L (ref 135–145)
Total Bilirubin: 0.4 mg/dL (ref 0.3–1.2)
Total Protein: 8.6 g/dL — ABNORMAL HIGH (ref 6.5–8.1)

## 2018-09-29 MED ORDER — CHLORPROMAZINE HCL 50 MG PO TABS
50.0000 mg | ORAL_TABLET | Freq: Once | ORAL | Status: DC
  Filled 2018-09-29: qty 1

## 2018-09-29 MED ORDER — ALUM & MAG HYDROXIDE-SIMETH 200-200-20 MG/5ML PO SUSP
30.0000 mL | ORAL | Status: DC | PRN
  Filled 2018-09-29: qty 30

## 2018-09-29 MED ORDER — HYDROXYZINE HCL 25 MG PO TABS
25.0000 mg | ORAL_TABLET | Freq: Four times a day (QID) | ORAL | Status: DC | PRN
  Filled 2018-09-29: qty 1

## 2018-09-29 MED ORDER — LORAZEPAM 1 MG PO TABS
1.0000 mg | ORAL_TABLET | Freq: Four times a day (QID) | ORAL | Status: DC | PRN
  Filled 2018-09-29: qty 1

## 2018-09-29 MED ORDER — FOLIC ACID 1 MG PO TABS
1.0000 mg | ORAL_TABLET | Freq: Every day | ORAL | Status: DC
  Administered 2018-09-29 – 2018-10-01 (×3): 1 mg via ORAL
  Filled 2018-09-29 (×7): qty 1

## 2018-09-29 MED ORDER — THIAMINE HCL 100 MG/ML IJ SOLN
100.0000 mg | Freq: Once | INTRAMUSCULAR | Status: DC
  Filled 2018-09-29: qty 1

## 2018-09-29 MED ORDER — INFLUENZA VAC SPLIT QUAD 0.5 ML IM SUSY
0.5000 mL | PREFILLED_SYRINGE | INTRAMUSCULAR | Status: DC
  Filled 2018-09-29: qty 0.5

## 2018-09-29 MED ORDER — MAGNESIUM HYDROXIDE 400 MG/5ML PO SUSP
30.0000 mL | Freq: Every day | ORAL | Status: DC | PRN
  Filled 2018-09-29: qty 30

## 2018-09-29 MED ORDER — RISPERIDONE 1 MG PO TABS
1.0000 mg | ORAL_TABLET | Freq: Once | ORAL | Status: AC
  Administered 2018-09-29: 1 mg via ORAL
  Filled 2018-09-29: qty 1

## 2018-09-29 MED ORDER — LORAZEPAM 1 MG PO TABS
1.0000 mg | ORAL_TABLET | Freq: Three times a day (TID) | ORAL | Status: DC
  Filled 2018-09-29: qty 1

## 2018-09-29 MED ORDER — ADULT MULTIVITAMIN W/MINERALS CH
1.0000 | ORAL_TABLET | Freq: Every day | ORAL | Status: DC
  Administered 2018-09-29 – 2018-10-01 (×3): 1 via ORAL
  Filled 2018-09-29 (×6): qty 1

## 2018-09-29 MED ORDER — RISPERIDONE 1 MG PO TABS
1.0000 mg | ORAL_TABLET | Freq: Every day | ORAL | Status: DC
  Administered 2018-09-30 (×2): 1 mg via ORAL
  Filled 2018-09-29 (×4): qty 1
  Filled 2018-09-29: qty 10

## 2018-09-29 MED ORDER — ACETAMINOPHEN 325 MG PO TABS
650.0000 mg | ORAL_TABLET | Freq: Four times a day (QID) | ORAL | Status: DC | PRN
  Filled 2018-09-29: qty 2

## 2018-09-29 MED ORDER — LORAZEPAM 1 MG PO TABS
1.0000 mg | ORAL_TABLET | Freq: Four times a day (QID) | ORAL | Status: DC
  Administered 2018-09-29 – 2018-09-30 (×2): 1 mg via ORAL
  Filled 2018-09-29 (×9): qty 1

## 2018-09-29 MED ORDER — ZIPRASIDONE MESYLATE 20 MG IM SOLR
20.0000 mg | Freq: Two times a day (BID) | INTRAMUSCULAR | Status: DC | PRN
  Filled 2018-09-29: qty 20

## 2018-09-29 MED ORDER — CHLORPROMAZINE HCL 25 MG/ML IJ SOLN
50.0000 mg | Freq: Once | INTRAMUSCULAR | Status: DC
  Filled 2018-09-29: qty 2

## 2018-09-29 MED ORDER — TRAZODONE HCL 100 MG PO TABS
100.0000 mg | ORAL_TABLET | Freq: Every evening | ORAL | Status: DC | PRN
  Filled 2018-09-29 (×2): qty 1

## 2018-09-29 MED ORDER — LORAZEPAM 1 MG PO TABS: 1.0000 mg | ORAL_TABLET | Freq: Two times a day (BID) | ORAL | Status: DC

## 2018-09-29 MED ORDER — LORAZEPAM 1 MG PO TABS
2.0000 mg | ORAL_TABLET | Freq: Once | ORAL | Status: AC
  Administered 2018-09-29: 18:00:00 2 mg via ORAL

## 2018-09-29 MED ORDER — HYDROXYZINE HCL 25 MG PO TABS
25.0000 mg | ORAL_TABLET | Freq: Three times a day (TID) | ORAL | Status: DC | PRN
  Filled 2018-09-29: qty 1

## 2018-09-29 MED ORDER — ONDANSETRON 4 MG PO TBDP
4.0000 mg | ORAL_TABLET | Freq: Four times a day (QID) | ORAL | Status: DC | PRN
  Filled 2018-09-29: qty 1

## 2018-09-29 MED ORDER — VITAMIN B-1 100 MG PO TABS
100.0000 mg | ORAL_TABLET | Freq: Every day | ORAL | Status: DC
  Administered 2018-09-30 – 2018-10-01 (×2): 100 mg via ORAL
  Filled 2018-09-29 (×5): qty 1

## 2018-09-29 MED ORDER — LORAZEPAM 1 MG PO TABS: 1.0000 mg | ORAL_TABLET | Freq: Every day | ORAL | Status: DC

## 2018-09-29 MED ORDER — LOPERAMIDE HCL 2 MG PO CAPS
2.0000 mg | ORAL_CAPSULE | ORAL | Status: DC | PRN
  Filled 2018-09-29: qty 2
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
## 2018-09-29 NOTE — Progress Notes (Signed)
Patient did not attend wrap up group because he was asleep. 
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
## 2018-09-29 NOTE — Progress Notes (Signed)
Pt currently asleep in bed. Respiration are even and unlabored. Pt in no sign of distress. Will continue to monitor.   

## 2018-09-29 NOTE — H&P (Signed)
Behavioral Health Medical Screening Exam  Edward Mora is an 33 y.o. male.  Total Time spent with patient: 20 minutes  Psychiatric Specialty Exam: Physical Exam  Nursing note and vitals reviewed. Constitutional: He is oriented to person, place, and time. He appears well-developed and well-nourished.  Cardiovascular: Normal rate.  Respiratory: Effort normal.  Musculoskeletal: Normal range of motion.  Neurological: He is alert and oriented to person, place, and time.  Skin: Skin is warm.    Review of Systems  Constitutional: Negative.   HENT: Negative.   Eyes: Negative.   Respiratory: Negative.   Cardiovascular: Negative.   Gastrointestinal: Negative.   Genitourinary: Negative.   Musculoskeletal: Negative.   Skin: Negative.   Neurological: Negative.   Endo/Heme/Allergies: Negative.   Psychiatric/Behavioral: Positive for depression, substance abuse and suicidal ideas. The patient has insomnia.     Blood pressure 134/88, pulse 68, temperature 98.3 F (36.8 C), resp. rate 18.There is no height or weight on file to calculate BMI.  General Appearance: Guarded  Eye Contact:  Minimal  Speech:  Clear and Coherent and Normal Rate  Volume:  Increased  Mood:  Angry, Depressed and Irritable  Affect:  Depressed and Tearful  Thought Process:  Coherent and Descriptions of Associations: Intact  Orientation:  Full (Time, Place, and Person)  Thought Content:  WDL  Suicidal Thoughts:  Reported SI on assessment paperwork then denied  Homicidal Thoughts:  Yes but then refused to give details  Memory:  Immediate;   Fair Recent;   Fair Remote;   Fair  Judgement:  Impaired  Insight:  Lacking  Psychomotor Activity:  Normal  Concentration: Concentration: Fair and Attention Span: Fair  Recall:  Good  Fund of Knowledge:Good  Language: Good  Akathisia:  No  Handed:  Right  AIMS (if indicated):     Assets:  Communication Skills Desire for Improvement Housing Physical  Health Resilience Social Support Transportation  Sleep:       Musculoskeletal: Strength & Muscle Tone: within normal limits Gait & Station: normal Patient leans: N/A  Blood pressure 134/88, pulse 68, temperature 98.3 F (36.8 C), resp. rate 18.  Recommendations:  Based on my evaluation the patient does not appear to have an emergency medical condition.  Gerlene Burdock Neiko Trivedi, FNP 09/29/2018, 4:03 PM

## 2018-09-30 LAB — RAPID URINE DRUG SCREEN, HOSP PERFORMED
Amphetamines: NOT DETECTED
Barbiturates: NOT DETECTED
Benzodiazepines: POSITIVE — AB
Cocaine: NOT DETECTED
Opiates: NOT DETECTED
Tetrahydrocannabinol: POSITIVE — AB
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
## 2018-09-30 NOTE — Progress Notes (Signed)
D: Patient made a high fall risk due to feeling dizzy, being on withdrawal protocol.  A: Educated patient on safety precautions, provided yellow socks, stickers placed, and fall bracelet. R: Patient refused all interventions and safety paraphernalia. Non compliant with safety precautions "I am fine."
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
## 2018-09-30 NOTE — BHH Counselor (Signed)
Clinical Social Work Note  CSW attempted to do Psychosocial Assessment with patient, but he was sleeping soundly and could not be roused.  Continue to follow.  Ambrose Mantle, LCSW 09/30/2018, 2:00 PM

## 2018-09-30 NOTE — BHH Group Notes (Signed)
BHH Group Notes: (Clinical Social Work)   09/30/2018      Type of Therapy:  Group Therapy   Participation Level:  Did Not Attend - was invited both individually by MHT and by overhead announcement   Ambrose Mantle, LCSW 09/30/2018, 12:28 PM

## 2018-09-30 NOTE — Plan of Care (Signed)
D: Patient presents depressed, agitated. He is frequently requesting to discharge and is angry that he won't be leaving today. He states "the medicine you all give me is making me feel dizzy." I instructed in safety precautions and made him a high fall risk. Patient denies SI/HI/AVH. He slept well last night and felt the medication was helpful for sleep. His appetite is good, energy high and concentration good. He denies withdrawal symptoms or physical complaints. His goal: "Be more cautious."  A: Patient checked q15 min, and checks reviewed. Reviewed medication changes with patient and educated on side effects. Educated patient on importance of attending group therapy sessions and educated on several coping skills. Encouarged participation in milieu through recreation therapy and attending meals with peers. Support and encouragement provided. Fluids offered. R: Patient receptive to education on medications, and is medication compliant. Patient contracts for safety on the unit.  Problem: Education: Goal: Emotional status will improve Outcome: Not Progressing Goal: Mental status will improve Outcome: Not Progressing   Problem: Activity: Goal: Interest or engagement in activities will improve Outcome: Not Progressing Goal: Sleeping patterns will improve Outcome: Not Progressing

## 2018-10-01 MED ORDER — DIVALPROEX SODIUM 250 MG PO DR TAB
250.0000 mg | DELAYED_RELEASE_TABLET | Freq: Once | ORAL | Status: AC
  Administered 2018-10-01: 09:00:00 250 mg via ORAL
  Filled 2018-10-01: qty 1

## 2018-10-01 MED ORDER — DIVALPROEX SODIUM 500 MG PO DR TAB
500.0000 mg | DELAYED_RELEASE_TABLET | Freq: Two times a day (BID) | ORAL | Status: DC
  Filled 2018-10-01 (×4): qty 1

## 2018-10-01 MED ORDER — DIVALPROEX SODIUM 500 MG PO DR TAB: 500.0000 mg | DELAYED_RELEASE_TABLET | Freq: Two times a day (BID) | ORAL | 0 refills | Status: DC

## 2018-10-01 MED ORDER — DIVALPROEX SODIUM 250 MG PO DR TAB
DELAYED_RELEASE_TABLET | ORAL | Status: AC
  Administered 2018-10-01: 09:00:00 250 mg via ORAL
  Filled 2018-10-01: qty 1

## 2018-10-01 MED ORDER — RISPERIDONE 1 MG PO TABS: 1.0000 mg | ORAL_TABLET | Freq: Every day | ORAL | 0 refills | Status: DC

## 2018-10-01 MED ORDER — LORAZEPAM 1 MG PO TABS
1.0000 mg | ORAL_TABLET | Freq: Once | ORAL | Status: AC
  Administered 2018-10-01: 09:00:00 1 mg via ORAL

## 2018-10-01 MED ORDER — TRAZODONE HCL 100 MG PO TABS: 100.0000 mg | ORAL_TABLET | Freq: Every evening | ORAL | 0 refills | Status: DC | PRN

## 2018-10-01 MED ORDER — LORAZEPAM 1 MG PO TABS
ORAL_TABLET | ORAL | Status: AC
  Administered 2018-10-01: 1 mg via ORAL
  Filled 2018-10-01: qty 1

## 2018-10-01 NOTE — Progress Notes (Signed)
  [facility] Adult Case Management Discharge Plan :  Will you be returning to the same living situation after discharge:  Yes,  between mother and friend's homes At discharge, do you have transportation home?: Yes,  friend Do you have the ability to pay for your medications: No.  No income or insurance, hopes his friend will help him  Release of information consent forms completed and turned in to Medical Records by CSW.   Patient to Follow up at: Follow-up Information    [facility] Follow up.   Why:  If you choose to pursue aftercare treatment, please call the above number within 7 days of discharge to schedule an assessment.  This will be done by telephone or video.  You will then be able to receive medication management and/or counseling as needed. Contact information: [address] [place] [address] [phone]           Next level of care provider has access to [facility] Link:no  Safety Planning and Suicide Prevention discussed: Yes,  with Friend Argie Ramming III, [phone]       Has patient been referred to the Quitline?: Patient refused referral  Patient has been referred for addiction treatment: Yes  Lynnell Chad, LCSW 10/01/2018, 9:37 AM

## 2018-10-01 NOTE — BHH Suicide Risk Assessment (Signed)
[facility] Discharge Suicide Risk Assessment   Principal Problem: Affective psychosis, bipolar (HCC) Discharge Diagnoses: Principal Problem:   Affective psychosis, bipolar (HCC) Active Problems:   Polysubstance dependence (HCC)   Suicidal ideation   Homicidal ideation   Total Time spent with patient: 15 minutes  Musculoskeletal: Strength & Muscle Tone: within normal limits Gait & Station: normal Patient leans: N/A  Psychiatric Specialty Exam: Review of Systems  All other systems reviewed and are negative.   Blood pressure 119/89, pulse 74, temperature 98.4 F (36.9 C), temperature source Oral, resp. rate 18, SpO2 100 %.There is no height or weight on file to calculate BMI.  General Appearance: Casual  Eye Contact::  Fair  Speech:  Normal Rate409  Volume:  Decreased  Mood:  Irritable  Affect:  Congruent  Thought Process:  Coherent and Descriptions of Associations: Intact  Orientation:  Full (Time, Place, and Person)  Thought Content:  Logical  Suicidal Thoughts:  No  Homicidal Thoughts:  No  Memory:  Immediate;   Fair Recent;   Fair Remote;   Fair  Judgement:  Intact  Insight:  Lacking  Psychomotor Activity:  Increased  Concentration:  Fair  Recall:  [employer] of Knowledge:Fair  Language: Fair  Akathisia:  Negative  Handed:  Right  AIMS (if indicated):     Assets:  Desire for Improvement Housing Resilience  Sleep:  Number of Hours: 6.75  Cognition: WNL  ADL's:  Intact   Mental Status Per Nursing Assessment::   On Admission:  NA  Demographic Factors:  Male, Low socioeconomic status and Unemployed  Loss Factors: Financial problems/change in socioeconomic status  Historical Factors: Impulsivity  Risk Reduction Factors:   NA  Continued Clinical Symptoms:  Bipolar Disorder:   Bipolar II Alcohol/Substance Abuse/Dependencies  Cognitive Features That Contribute To Risk:  None    Suicide Risk:  Minimal: No identifiable suicidal ideation.  Patients  presenting with no risk factors but with morbid ruminations; may be classified as minimal risk based on the severity of the depressive symptoms    Plan Of Care/Follow-up recommendations:  Activity:  ad lb  Antonieta Pert, MD 10/01/2018, 9:24 AM
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
## 2018-10-01 NOTE — Progress Notes (Signed)
D: Patient agitated this morning, demanding discharge. Stating "I don't need to be here, I am supposed to leave today." He was disruptive to milieu, cussing at staff, yelling on the phone, difficult to redirect. He was offered an IM injection of Geodon or PO and he agreed to take PO.  A: Ativan and depakote given.  R: Patient showed little improvement an hour later. Still yelling on phone. Less threatening towards staff, however.

## 2018-10-01 NOTE — Progress Notes (Signed)
D: Pt A & O X 3. Denies SI, HI, AVH and pain at this time. D/C home as ordered. Picked up in lobby by a friend. A: D/C instructions reviewed with pt including prescriptions and follow up appointment, compliance encouraged. Patient had no belongings on admission. Scheduled and PRN medications given with verbal education and effects monitored. Safety checks maintained without incident till time of d/c.  R: Pt receptive to care. Compliant with medications when offered. Denies adverse drug reactions when assessed. Verbalized understanding related to d/c instructions. Signed belonging sheet in agreement with items received from locker. Ambulatory with a steady gait. Appears to be in no physical distress at time of departure.

## 2018-10-01 NOTE — BHH Suicide Risk Assessment (Signed)
BHH INPATIENT:  Family/Significant Other Suicide Prevention Education  Suicide Prevention Education:  Contact Attempts: Friend Argie Ramming III, [phone]  (name of family member/significant other) has been identified by the patient as the family member/significant other with whom the patient will be residing, and identified as the person(s) who will aid the patient in the event of a mental health crisis.  With written consent from the patient, two attempts were made to provide suicide prevention education, prior to and/or following the patient's discharge.  We were unsuccessful in providing suicide prevention education.  A suicide education pamphlet was given to the patient to share with family/significant other.  Date and time of first attempt:   10/01/2018  9:35 AM - HIPAA-compliant voicemail left Date and time of second attempt:  Education completed, see note  Lynnell Chad 10/01/2018, 9:35 AM

## 2018-10-01 NOTE — Progress Notes (Signed)
DAR NOTE: Pt present with anxious  affect and irritable mood in the unit. Pt complained of not being provided with mask, pt stated " I do not want to catch coronavirus and take to my children" Pt also complained of being not put on withdrawal medication, but will not explain which medication it was. Pt denies physical pain, took all his meds as scheduled. Pt's safety ensured with 15 minute and environmental checks. Pt currently denies SI/HI and A/V hallucinations. Pt verbally agrees to seek staff if SI/HI or A/VH occurs and to consult with staff before acting on these thoughts. Will continue POC.

## 2018-10-01 NOTE — BHH Group Notes (Signed)
[facility] LCSW Group Therapy Note  Date/Time:  10/01/2018  11:00AM-12:00PM  Type of Therapy and Topic:  Group Therapy:  Music and Mood  Participation Level:  Active   Description of Group: In this process group, members listened to a variety of genres of music and identified that different types of music evoke different responses.  Patients were encouraged to identify music that was soothing for them and music that was energizing for them.  Patients discussed how this knowledge can help with wellness and recovery in various ways including managing depression and anxiety as well as encouraging healthy sleep habits.    Therapeutic Goals: 1. Patients will explore the impact of different varieties of music on mood 2. Patients will verbalize the thoughts they have when listening to different types of music 3. Patients will identify music that is soothing to them as well as music that is energizing to them 4. Patients will discuss how to use this knowledge to assist in maintaining wellness and recovery 5. Patients will explore the use of music as a coping skill  Summary of Patient Progress:  At the beginning of group, patient expressed that he had a bad dream last night and was having a hard time coping with it, "I took it out on the officers this morning."  He stated at the end of group he felt much better.  Therapeutic Modalities: Solution Focused Brief Therapy Activity   Ambrose Mantle, LCSW

## 2018-10-01 NOTE — BHH Suicide Risk Assessment (Signed)
[facility] INPATIENT:  Family/Significant Other Suicide Prevention Education  Suicide Prevention Education:  Education Completed; Docia Barrier III, [phone]  ,  (name of family member/significant other) has been identified by the patient as the family member/significant other with whom the patient will be residing, and identified as the person(s) who will aid the patient in the event of a mental health crisis (suicidal ideations/suicide attempt).  With written consent from the patient, the family member/significant other has been provided the following suicide prevention education, prior to the and/or following the discharge of the patient.  The suicide prevention education provided includes the following:  Suicide risk factors  Suicide prevention and interventions  National Suicide Hotline telephone number  [facility] assessment telephone number  [facility] Emergency Assistance 911  [facility] and/or Residential Mobile Crisis Unit telephone number  Request made of family/significant other to:  Remove weapons (e.g., guns, rifles, knives), all items previously/currently identified as safety concern.    Remove drugs/medications (over-the-counter, prescriptions, illicit drugs), all items previously/currently identified as a safety concern.  The family member/significant other verbalizes understanding of the suicide prevention education information provided.  The family member/significant other agrees to remove the items of safety concern listed above.   Friend does own a gun but pt does not have access "It is put up."  Friend stated he was familiar with suicide prevention information.  Carloyn Jaeger Grossman-Orr 10/01/2018, 1:31 PM

## 2018-10-01 NOTE — Discharge Instructions (Signed)
Living With Bipolar Disorder °If you have been diagnosed with bipolar disorder, you may be relieved that you now know why you have felt or behaved a certain way. You may also feel overwhelmed about the treatment ahead, how to get the support you need, and how to deal with the condition day-to-day. With care and support, you can learn to manage your symptoms and live with bipolar disorder. °How to manage lifestyle changes °Managing stress °Stress is your body's reaction to life changes and events, both good and bad. Stress can play a major role in bipolar disorder, so it is important to learn how to cope with stress. Some techniques to cope with stress include: °· Meditation, muscle relaxation, and breathing exercises. °· Exercise. Even a short daily walk can help to lower stress levels. °· Getting enough good-quality sleep. Too little sleep can cause mania to start (can trigger mania). °· Making a schedule to manage your time. Knowing your daily schedule can help to keep you from feeling overwhelmed by tasks and deadlines. °· Spending time on hobbies that you enjoy. ° °Medicines °Your health care provider may suggest certain medicines if he or she feels that they will help improve your condition. Avoid using caffeine, alcohol, and other substances that may prevent your medicines from working properly (may interact). It is also important to: °· Talk with your pharmacist or health care provider about all the medicines that you take, their possible side effects, and which medicines are safe to take together. °· Make it your goal to take part in all treatment decisions (shared decision-making). Ask about possible side effects of medicines that your health care provider recommends, and tell him or her how you feel about having those side effects. It is best if shared decision-making with your health care provider is part of your total treatment plan. °If you are taking medicines as part of your treatment, do not stop  taking medicines before you ask your health care provider if it is safe to stop. You may need to have the medicine slowly decreased (tapered) over time to decrease the risk of harmful side effects. °Relationships °Spend time with people that you trust and with whom you feel a sense of understanding and calm. Try to find friends or family members who make you feel safe and can help you control feelings of mania. Consider going to couples counseling, family education classes, or family therapy to: °· Educate your loved ones about your condition and offer suggestions about how they can support you. °· Help resolve conflicts. °· Help develop communication skills in your relationships. ° °How to recognize changes in your condition °Everyone responds differently to treatment for bipolar disorder. Some signs that your condition is improving include: °· Leveling of your mood. You may have less anger and excitement about daily activities, and your low moods may not be as bad. °· Your symptoms being less intense. °· Feeling calm more often. °· Thinking clearly. °· Not experiencing consequences for extreme behavior. °· Feeling like your life is settling down. °· Your behavior seeming more normal to you and to other people. °Some signs that your condition may be getting worse include: °· Sleep problems. °· Moods cycling between deep lows and unusually high (excess) energy. °· Extreme emotions. °· More anger at loved ones. °· Staying away from others (isolating yourself). °· A feeling of power or superiority. °· Completing a lot of tasks in a very short amount of time. °· Unusual thoughts and behaviors. °· Suicidal   thoughts. °Where to find support °Talking to others °· Try making a list of the people you may want to tell about your condition, such as the people you trust most. °· Plan what you are willing to talk about and what you do not want to discuss. Think about your needs ahead of time, and how your friends and family  members can support you. °· Let your loved ones know when they can share advice and when you would just like them to listen. °· Give your loved ones information about bipolar disorder, and encourage them to learn about the condition. °Finances °Not all insurance plans cover mental health care, so it is important to check with your insurance carrier. If paying for co-pays or counseling services is a problem, search for a local or county mental health care center. Public mental health care services may be offered there at a low cost or no cost when you are not able to see a private health care provider. °If you are taking medicine for depression, you may be able to get the generic form, which may be less expensive than brand-name medicine. Some makers of prescription medicines also offer help to patients who cannot afford the medicines they need. °Follow these instructions at home: °Medicines °· Take over-the-counter and prescription medicines only as told by your health care provider or pharmacist. °· Ask your pharmacist what over-the-counter cold medicines you should avoid. Some medicines can make symptoms worse. °General instructions °· Ask for support from trusted family members or friends to make sure you stay on track with your treatment. °· Keep a journal to write down your daily moods, medicines, sleep habits, and life events. This may help you have more success with your treatment. °· Make and follow a routine for daily meal times. Eat healthy foods, such as whole grains, vegetables, and fresh fruit. °· Try to go to sleep and wake up around the same time every day. °· Keep all follow-up visits as told by your health care provider. This is important. °Questions to ask your health care provider: °· If you are taking medicines: °? How long do I need to take medicine? °? Are there any long-term side effects of my medicine? °? Are there any alternatives to taking medicine? °· How would I benefit from  therapy? °· How often should I follow up with a health care provider? °Contact a health care provider if: °· Your symptoms get worse or they do not get better with treatment. °Get help right away if: °· You have thoughts about harming yourself or others. °If you ever feel like you may hurt yourself or others, or have thoughts about taking your own life, get help right away. You can go to your nearest emergency department or call: °· Your local emergency services (911 in the [place] °· A suicide crisis helpline, such as the National Suicide Prevention Lifeline at 1-800-273-8255. This is open 24-hours a day. °Summary °· Learning ways to deal with stress can help to calm you and may also help your treatment work better. °· There is a wide range of medicines that can help to treat bipolar disorder. °· Having healthy relationships can help to make your moods more stable. °· Contact a health care provider if your symptoms get worse or they do not get better with treatment. °This information is not intended to replace advice given to you by your health care provider. Make sure you discuss any questions you have with your health care provider. °  Document Released: 10/07/2016 Document Revised: 10/07/2016 Document Reviewed: 10/07/2016 °[employer]vier Interactive Patient Education © 2019 [employer] ° °

## 2018-10-02 LAB — HEPATITIS PANEL, ACUTE
HCV Ab: 0.2 s/co ratio (ref 0.0–0.9)
Hep A IgM: NEGATIVE
Hep B C IgM: NEGATIVE
Hepatitis B Surface Ag: NEGATIVE

## 2018-10-29 ENCOUNTER — Observation Stay (HOSPITAL_COMMUNITY)
Admission: AD | Admit: 2018-10-29 | Discharge: 2018-10-30 | Disposition: A | Discharge status: Home / Self Care | Payer: No Typology Code available for payment source | Source: Other Acute Inpatient Hospital | Attending: Psychiatry | Admitting: Psychiatry

## 2018-10-29 ENCOUNTER — Observation Stay (HOSPITAL_COMMUNITY)
Admission: AD | Admit: 2018-10-29 | Payer: No Typology Code available for payment source | Source: Intra-hospital | Admitting: Psychiatry

## 2018-10-29 ENCOUNTER — Emergency Department (HOSPITAL_COMMUNITY): Payer: Self-pay

## 2018-10-29 ENCOUNTER — Other Ambulatory Visit: Payer: Self-pay

## 2018-10-29 ENCOUNTER — Emergency Department (HOSPITAL_COMMUNITY)
Admission: EM | Admit: 2018-10-29 | Discharge: 2018-10-29 | Disposition: A | Discharge status: Other Acute Inpatient Hospital | Payer: Self-pay | Attending: Emergency Medicine | Admitting: Emergency Medicine

## 2018-10-29 ENCOUNTER — Encounter (HOSPITAL_COMMUNITY): Payer: Self-pay

## 2018-10-29 DIAGNOSIS — F319 Bipolar disorder, unspecified: Secondary | ICD-10-CM | POA: Insufficient documentation

## 2018-10-29 DIAGNOSIS — Y9389 Activity, other specified: Secondary | ICD-10-CM | POA: Insufficient documentation

## 2018-10-29 DIAGNOSIS — F1414 Cocaine abuse with cocaine-induced mood disorder: Secondary | ICD-10-CM | POA: Diagnosis present

## 2018-10-29 DIAGNOSIS — F419 Anxiety disorder, unspecified: Secondary | ICD-10-CM | POA: Insufficient documentation

## 2018-10-29 DIAGNOSIS — Z79899 Other long term (current) drug therapy: Secondary | ICD-10-CM | POA: Insufficient documentation

## 2018-10-29 DIAGNOSIS — R45850 Homicidal ideations: Secondary | ICD-10-CM | POA: Insufficient documentation

## 2018-10-29 DIAGNOSIS — F314 Bipolar disorder, current episode depressed, severe, without psychotic features: Secondary | ICD-10-CM | POA: Insufficient documentation

## 2018-10-29 DIAGNOSIS — F1920 Other psychoactive substance dependence, uncomplicated: Secondary | ICD-10-CM | POA: Diagnosis present

## 2018-10-29 DIAGNOSIS — X58XXXA Exposure to other specified factors, initial encounter: Secondary | ICD-10-CM | POA: Insufficient documentation

## 2018-10-29 DIAGNOSIS — F1410 Cocaine abuse, uncomplicated: Principal | ICD-10-CM | POA: Insufficient documentation

## 2018-10-29 DIAGNOSIS — Y905 Blood alcohol level of 100-119 mg/100 ml: Secondary | ICD-10-CM | POA: Insufficient documentation

## 2018-10-29 DIAGNOSIS — Y999 Unspecified external cause status: Secondary | ICD-10-CM | POA: Insufficient documentation

## 2018-10-29 DIAGNOSIS — Z046 Encounter for general psychiatric examination, requested by authority: Secondary | ICD-10-CM

## 2018-10-29 DIAGNOSIS — F10920 Alcohol use, unspecified with intoxication, uncomplicated: Secondary | ICD-10-CM | POA: Insufficient documentation

## 2018-10-29 DIAGNOSIS — F17210 Nicotine dependence, cigarettes, uncomplicated: Secondary | ICD-10-CM | POA: Insufficient documentation

## 2018-10-29 DIAGNOSIS — F332 Major depressive disorder, recurrent severe without psychotic features: Secondary | ICD-10-CM | POA: Diagnosis present

## 2018-10-29 DIAGNOSIS — R45851 Suicidal ideations: Secondary | ICD-10-CM | POA: Insufficient documentation

## 2018-10-29 DIAGNOSIS — Y929 Unspecified place or not applicable: Secondary | ICD-10-CM | POA: Insufficient documentation

## 2018-10-29 DIAGNOSIS — F1010 Alcohol abuse, uncomplicated: Secondary | ICD-10-CM | POA: Diagnosis present

## 2018-10-29 DIAGNOSIS — S61411A Laceration without foreign body of right hand, initial encounter: Secondary | ICD-10-CM | POA: Insufficient documentation

## 2018-10-29 DIAGNOSIS — Z1159 Encounter for screening for other viral diseases: Secondary | ICD-10-CM | POA: Insufficient documentation

## 2018-10-29 LAB — CBC WITH DIFFERENTIAL/PLATELET
Abs Immature Granulocytes: 0.05 10*3/uL (ref 0.00–0.07)
Basophils Absolute: 0.1 10*3/uL (ref 0.0–0.1)
Basophils Relative: 1 %
Eosinophils Absolute: 0.2 10*3/uL (ref 0.0–0.5)
Eosinophils Relative: 2 %
HCT: 42.7 % (ref 39.0–52.0)
Hemoglobin: 13.3 g/dL (ref 13.0–17.0)
Immature Granulocytes: 1 %
Lymphocytes Relative: 24 %
Lymphs Abs: 2.4 10*3/uL (ref 0.7–4.0)
MCH: 28.5 pg (ref 26.0–34.0)
MCHC: 31.1 g/dL (ref 30.0–36.0)
MCV: 91.4 fL (ref 80.0–100.0)
Monocytes Absolute: 0.9 10*3/uL (ref 0.1–1.0)
Monocytes Relative: 9 %
Neutro Abs: 6.3 10*3/uL (ref 1.7–7.7)
Neutrophils Relative %: 63 %
Platelets: 439 10*3/uL — ABNORMAL HIGH (ref 150–400)
RBC: 4.67 MIL/uL (ref 4.22–5.81)
RDW: 13.5 % (ref 11.5–15.5)
WBC: 9.9 10*3/uL (ref 4.0–10.5)
nRBC: 0 % (ref 0.0–0.2)

## 2018-10-29 LAB — RAPID URINE DRUG SCREEN, HOSP PERFORMED
Amphetamines: NOT DETECTED
Barbiturates: NOT DETECTED
Benzodiazepines: NOT DETECTED
Cocaine: POSITIVE — AB
Opiates: NOT DETECTED
Tetrahydrocannabinol: POSITIVE — AB

## 2018-10-29 LAB — COMPREHENSIVE METABOLIC PANEL
ALT: 38 U/L (ref 0–44)
AST: 38 U/L (ref 15–41)
Albumin: 4.6 g/dL (ref 3.5–5.0)
Alkaline Phosphatase: 67 U/L (ref 38–126)
Anion gap: 13 (ref 5–15)
BUN: 18 mg/dL (ref 6–20)
CO2: 23 mmol/L (ref 22–32)
Calcium: 9.4 mg/dL (ref 8.9–10.3)
Chloride: 105 mmol/L (ref 98–111)
Creatinine, Ser: 1.33 mg/dL — ABNORMAL HIGH (ref 0.61–1.24)
GFR calc Af Amer: >60 mL/min (ref >60)
GFR calc non Af Amer: >60 mL/min (ref >60)
Glucose, Bld: 106 mg/dL — ABNORMAL HIGH (ref 70–99)
Potassium: 4.2 mmol/L (ref 3.5–5.1)
Sodium: 141 mmol/L (ref 135–145)
Total Bilirubin: 0.4 mg/dL (ref 0.3–1.2)
Total Protein: 8.9 g/dL — ABNORMAL HIGH (ref 6.5–8.1)

## 2018-10-29 LAB — SARS CORONAVIRUS 2 BY RT PCR (HOSPITAL ORDER, PERFORMED IN ~~LOC~~ HOSPITAL LAB): SARS Coronavirus 2: NEGATIVE

## 2018-10-29 LAB — ACETAMINOPHEN LEVEL: Acetaminophen (Tylenol), Serum: <10 ug/mL — ABNORMAL LOW (ref 10–30)

## 2018-10-29 LAB — ETHANOL: Alcohol, Ethyl (B): 117 mg/dL — ABNORMAL HIGH (ref <10)

## 2018-10-29 LAB — SALICYLATE LEVEL: Salicylate Lvl: <7 mg/dL (ref 2.8–30.0)

## 2018-10-29 MED ORDER — ACETAMINOPHEN 325 MG PO TABS: 650.0000 mg | ORAL_TABLET | Freq: Four times a day (QID) | ORAL | Status: DC | PRN

## 2018-10-29 MED ORDER — TRAZODONE HCL 50 MG PO TABS: 50.0000 mg | ORAL_TABLET | Freq: Every evening | ORAL | Status: DC | PRN

## 2018-10-29 MED ORDER — THIAMINE HCL 100 MG/ML IJ SOLN: 100.0000 mg | Freq: Every day | INTRAMUSCULAR | Status: DC

## 2018-10-29 MED ORDER — ONDANSETRON HCL 4 MG PO TABS
4.0000 mg | ORAL_TABLET | Freq: Three times a day (TID) | ORAL | Status: DC | PRN
  Filled 2018-10-29: qty 1

## 2018-10-29 MED ORDER — HALOPERIDOL 5 MG PO TABS
ORAL_TABLET | ORAL | Status: AC
  Filled 2018-10-29: qty 1

## 2018-10-29 MED ORDER — GABAPENTIN 300 MG PO CAPS
300.0000 mg | ORAL_CAPSULE | Freq: Two times a day (BID) | ORAL | Status: DC
  Administered 2018-10-29: 21:00:00 300 mg via ORAL

## 2018-10-29 MED ORDER — TRAZODONE HCL 100 MG PO TABS: 100.0000 mg | ORAL_TABLET | Freq: Every evening | ORAL | Status: DC | PRN

## 2018-10-29 MED ORDER — IBUPROFEN 600 MG PO TABS: 600.0000 mg | ORAL_TABLET | Freq: Three times a day (TID) | ORAL | Status: DC | PRN

## 2018-10-29 MED ORDER — ALUM & MAG HYDROXIDE-SIMETH 200-200-20 MG/5ML PO SUSP: 30.0000 mL | Freq: Four times a day (QID) | ORAL | Status: DC | PRN

## 2018-10-29 MED ORDER — MAGNESIUM HYDROXIDE 400 MG/5ML PO SUSP: 30.0000 mL | Freq: Every day | ORAL | Status: DC | PRN

## 2018-10-29 MED ORDER — DIPHENHYDRAMINE HCL 50 MG/ML IJ SOLN
50.0000 mg | Freq: Once | INTRAMUSCULAR | Status: AC
  Administered 2018-10-29: 50 mg via INTRAMUSCULAR
  Filled 2018-10-29: qty 1

## 2018-10-29 MED ORDER — NICOTINE 21 MG/24HR TD PT24: 21.0000 mg | MEDICATED_PATCH | Freq: Every day | TRANSDERMAL | Status: DC

## 2018-10-29 MED ORDER — GABAPENTIN 300 MG PO CAPS
ORAL_CAPSULE | ORAL | Status: AC
  Administered 2018-10-29: 300 mg via ORAL
  Filled 2018-10-29: qty 1

## 2018-10-29 MED ORDER — ALUM & MAG HYDROXIDE-SIMETH 200-200-20 MG/5ML PO SUSP: 30.0000 mL | ORAL | Status: DC | PRN

## 2018-10-29 MED ORDER — AMANTADINE HCL 100 MG PO CAPS
100.0000 mg | ORAL_CAPSULE | Freq: Two times a day (BID) | ORAL | Status: DC
  Administered 2018-10-29: 100 mg via ORAL
  Filled 2018-10-29 (×4): qty 1

## 2018-10-29 MED ORDER — VITAMIN B-1 100 MG PO TABS
100.0000 mg | ORAL_TABLET | Freq: Every day | ORAL | Status: DC
  Administered 2018-10-29: 100 mg via ORAL
  Filled 2018-10-29: qty 1

## 2018-10-29 MED ORDER — HALOPERIDOL 5 MG PO TABS: 5.0000 mg | ORAL_TABLET | Freq: Two times a day (BID) | ORAL | Status: DC

## 2018-10-29 MED ORDER — AMANTADINE HCL 100 MG PO CAPS
ORAL_CAPSULE | ORAL | Status: AC
  Filled 2018-10-29: qty 1

## 2018-10-29 MED ORDER — LORAZEPAM 2 MG/ML IJ SOLN
2.0000 mg | Freq: Once | INTRAMUSCULAR | Status: AC
  Administered 2018-10-29: 2 mg via INTRAMUSCULAR
  Filled 2018-10-29: qty 1

## 2018-10-29 MED ORDER — IBUPROFEN 200 MG PO TABS: 600.0000 mg | ORAL_TABLET | Freq: Three times a day (TID) | ORAL | Status: DC | PRN

## 2018-10-29 MED ORDER — GABAPENTIN 300 MG PO CAPS
300.0000 mg | ORAL_CAPSULE | Freq: Two times a day (BID) | ORAL | Status: DC
  Administered 2018-10-29: 300 mg via ORAL
  Filled 2018-10-29: qty 1

## 2018-10-29 MED ORDER — HALOPERIDOL DECANOATE 100 MG/ML IM SOLN
50.0000 mg | Freq: Once | INTRAMUSCULAR | Status: AC
  Administered 2018-10-29: 50 mg via INTRAMUSCULAR
  Filled 2018-10-29: qty 0.5

## 2018-10-29 MED ORDER — ONDANSETRON HCL 4 MG PO TABS: 4.0000 mg | ORAL_TABLET | Freq: Three times a day (TID) | ORAL | Status: DC | PRN

## 2018-10-29 MED ORDER — HALOPERIDOL 5 MG PO TABS
5.0000 mg | ORAL_TABLET | Freq: Two times a day (BID) | ORAL | Status: DC
  Administered 2018-10-29: 5 mg via ORAL
  Filled 2018-10-29: qty 1

## 2018-10-29 MED ORDER — AMANTADINE HCL 100 MG PO CAPS
100.0000 mg | ORAL_CAPSULE | Freq: Two times a day (BID) | ORAL | Status: DC
  Administered 2018-10-29: 100 mg via ORAL
  Filled 2018-10-29: qty 1

## 2018-10-29 MED ORDER — NICOTINE 21 MG/24HR TD PT24
21.0000 mg | MEDICATED_PATCH | Freq: Every day | TRANSDERMAL | Status: DC
  Filled 2018-10-29: qty 1

## 2018-10-29 NOTE — ED Notes (Signed)
Report called to Jonny Ruiz RN at [facility]. [employer] called for transport.

## 2018-10-29 NOTE — [facility] Assessment (Signed)
[facility] Assessment Progress Note  Per Elta Guadeloupe, NP, patient will be seen for final disposition by the psychiatrist due to his recent hospitalization and his non-compliance with aftercare.

## 2018-10-29 NOTE — ED Notes (Signed)
Pt cooperative with care plan. Allowed injections to be given. Pleasant with staff. Called his mother and wished her happy Mother's Day.

## 2018-10-29 NOTE — ED Notes (Signed)
Patient made aware that urine sample is needed. Urinal at bedside.  

## 2018-10-29 NOTE — ED Notes (Signed)
Transported To [facility] by [employer].  Belongings returned to police to carry to [facility]. Pt was cooperative.
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
## 2018-10-29 NOTE — Plan of Care (Signed)
[facility] Observation Crisis Plan  Reason for Crisis Plan:  Crisis Stabilization and Medication Management   Plan of Care:  Referral for IOP  Family Support:      Current Living Environment:  Living Arrangements: Parent  Insurance:   Hospital Account    Name Acct ID Class Status Primary Coverage   Edward, Mora [id number] BEHAVIORAL HEALTH OBSERVATION [facility] FOR [facility]/DD/SAS [facility] [place] [employer]        Guarantor Account (for Hospital Account [id number])    Name Relation to Pt Service Area Active? Acct Type   Edward Mora Self [facility] Yes [facility]   Address Phone       [address] [place], [place] [address] [phone])          Coverage Information (for Hospital Account [id number])    F/O Payor/Plan Precert #   [facility] FOR MH/DD/SAS/[place] [employer]    Subscriber Subscriber #   Edward Mora, Edward Mora [id number]   Address Phone   [address] [place], [place] [address] [phone]      Legal Guardian:     Primary Care Provider:  Patient, No Pcp Per  Current Outpatient Providers:  None at this time  Psychiatrist:     Counselor/Therapist:     Compliant with Medications:  No  Additional Information:   Edward Mora 5/10/20207:44 PM

## 2018-10-29 NOTE — ED Notes (Signed)
[employer] brought an "Order to [employer]" so that they can be called at the time that pt is discharged. It is on his shadow chart. Pt continues to sleep and has been cooperative.

## 2018-10-29 NOTE — ED Notes (Signed)
Pt oriented upon waking this morning, Covid swab obtained, pt states he is thirsty and hungry, pt changed into scrubs and wanded, Diane RN TCU has received report, Pt will go to room 32, breakfast sent with pt. He is able to ambulate to unit.

## 2018-10-29 NOTE — BH Assessment (Signed)
Attempted assessment but Pt too somnolent to answer questions. TTS will assess Pt when Pt is alert and able to participate.   Pamalee Leyden, [facility], [facility], [facility] Triage Specialist [phone]
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
## 2018-10-29 NOTE — ED Notes (Signed)
According to Patty D. RN, she completed the [facility] Virus test and took it to lab prior to bringing pt to the Acute Unit.
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
## 2018-10-29 NOTE — Progress Notes (Signed)
Patient ID: Edward Mora, male   DOB: May 27, 1986, 33 y.o.   MRN: [id number]  Pt will remain in the emergency room for 24 hours for medication efficacy and observation for any potential adverse effects. Pt was placed on Haldol decanoate IM 50 mg today. His next IM injection will be due on or around November 26, 2018.   Laveda Abbe, FNP-C 10/29/2018    1259
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
## 2018-10-30 DIAGNOSIS — F332 Major depressive disorder, recurrent severe without psychotic features: Secondary | ICD-10-CM

## 2018-10-30 DIAGNOSIS — F1010 Alcohol abuse, uncomplicated: Secondary | ICD-10-CM | POA: Diagnosis present

## 2018-10-30 DIAGNOSIS — F1414 Cocaine abuse with cocaine-induced mood disorder: Secondary | ICD-10-CM | POA: Diagnosis not present
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
## 2018-10-30 NOTE — Progress Notes (Signed)
D:  Robt was isolative to his room all evening.  He was irritable when staff tried to engage him in conversation.  He denied SI/HI or A/V hallucinations.  He did wake up to take his medications but refused to take the haldol PO as "I took a shot over there of that and I am not going to take the pill."  Tried to explain about the decanoate medication but he adamantly refused to the the medication.  He has been asleep much of the shift.  A:  1:1 with RN for support and encouragement.  Medications as ordered.  Q 15 minute checks maintained for safety.   R:  Phillp remains safe on the unit.

## 2018-10-30 NOTE — BH Assessment (Signed)
[facility] Assessment Progress Note  Per Juanetta Beets, DO, this pt does not require psychiatric hospitalization at this time.  Pt presents under IVC initiated by law enforcement, and upheld by EDP Kristen Ward, DO, which Dr Sharma Covert has rescinded.  Pt is to be discharged from [facility].  No discharge instructions are required.  Pt's nurse, Lincoln Maxin, has been notified.  Doylene Canning, MA Triage Specialist [phone]

## 2018-10-30 NOTE — Discharge Instructions (Signed)
Living With Depression  Everyone experiences occasional disappointment, sadness, and loss in their lives. When you are feeling down, blue, or sad for at least 2 weeks in a row, it may mean that you have depression. Depression can affect your thoughts and feelings, relationships, daily activities, and physical health. It is caused by changes in the way your brain functions. If you receive a diagnosis of depression, your health care provider will tell you which type of depression you have and what treatment options are available to you.  If you are living with depression, there are ways to help you recover from it and also ways to prevent it from coming back.  How to cope with lifestyle changes  Coping with stress         Stress is your body's reaction to life changes and events, both good and bad. Stressful situations may include:   Getting married.   The death of a spouse.   Losing a job.   Retiring.   Having a baby.  Stress can last just a few hours or it can be ongoing. Stress can play a major role in depression, so it is important to learn both how to cope with stress and how to think about it differently.  Talk with your health care provider or a counselor if you would like to learn more about stress reduction. He or she may suggest some stress reduction techniques, such as:   Music therapy. This can include creating music or listening to music. Choose music that you enjoy and that inspires you.   Mindfulness-based meditation. This kind of meditation can be done while sitting or walking. It involves being aware of your normal breaths, rather than trying to control your breathing.   Centering prayer. This is a kind of meditation that involves focusing on a spiritual word or phrase. Choose a word, phrase, or sacred image that is meaningful to you and that brings you peace.   Deep breathing. To do this, expand your stomach and inhale slowly through your nose. Hold your breath for 3-5 seconds, then exhale  slowly, allowing your stomach muscles to relax.   Muscle relaxation. This involves intentionally tensing muscles then relaxing them.  Choose a stress reduction technique that fits your lifestyle and personality. Stress reduction techniques take time and practice to develop. Set aside 5-15 minutes a day to do them. Therapists can offer training in these techniques. The training may be covered by some insurance plans. Other things you can do to manage stress include:   Keeping a stress diary. This can help you learn what triggers your stress and ways to control your response.   Understanding what your limits are and saying no to requests or events that lead to a schedule that is too full.   Thinking about how you respond to certain situations. You may not be able to control everything, but you can control how you react.   Adding humor to your life by watching funny films or TV shows.   Making time for activities that help you relax and not feeling guilty about spending your time this way.    Medicines  Your health care provider may suggest certain medicines if he or she feels that they will help improve your condition. Avoid using alcohol and other substances that may prevent your medicines from working properly (may interact). It is also important to:   Talk with your pharmacist or health care provider about all the medicines that you take, their   possible side effects, and what medicines are safe to take together.   Make it your goal to take part in all treatment decisions (shared decision-making). This includes giving input on the side effects of medicines. It is best if shared decision-making with your health care provider is part of your total treatment plan.  If your health care provider prescribes a medicine, you may not notice the full benefits of it for 4-8 weeks. Most people who are treated for depression need to be on medicine for at least 6-12 months after they feel better. If you are taking  medicines as part of your treatment, do not stop taking medicines without first talking to your health care provider. You may need to have the medicine slowly decreased (tapered) over time to decrease the risk of harmful side effects.  Relationships  Your health care provider may suggest family therapy along with individual therapy and drug therapy. While there may not be family problems that are causing you to feel depressed, it is still important to make sure your family learns as much as they can about your mental health. Having your family's support can help make your treatment successful.  How to recognize changes in your condition  Everyone has a different response to treatment for depression. Recovery from major depression happens when you have not had signs of major depression for two months. This may mean that you will start to:   Have more interest in doing activities.   Feel less hopeless than you did 2 months ago.   Have more energy.   Overeat less often, or have better or improving appetite.   Have better concentration.  Your health care provider will work with you to decide the next steps in your recovery. It is also important to recognize when your condition is getting worse. Watch for these signs:   Having fatigue or low energy.   Eating too much or too little.   Sleeping too much or too little.   Feeling restless, agitated, or hopeless.   Having trouble concentrating or making decisions.   Having unexplained physical complaints.   Feeling irritable, angry, or aggressive.  Get help as soon as you or your family members notice these symptoms coming back.  How to get support and help from others  How to talk with friends and family members about your condition    Talking to friends and family members about your condition can provide you with one way to get support and guidance. Reach out to trusted friends or family members, explain your symptoms to them, and let them know that you are  working with a health care provider to treat your depression.  Financial resources  Not all insurance plans cover mental health care, so it is important to check with your insurance carrier. If paying for co-pays or counseling services is a problem, search for a local or county mental health care center. They may be able to offer public mental health care services at low or no cost when you are not able to see a private health care provider.  If you are taking medicine for depression, you may be able to get the generic form, which may be less expensive. Some makers of prescription medicines also offer help to patients who cannot afford the medicines they need.  Follow these instructions at home:     Get the right amount and quality of sleep.   Cut down on using caffeine, tobacco, alcohol, and other potentially harmful substances.     Try to exercise, such as walking or lifting small weights.   Take over-the-counter and prescription medicines only as told by your health care provider.   Eat a healthy diet that includes plenty of vegetables, fruits, whole grains, low-fat dairy products, and lean protein. Do not eat a lot of foods that are high in solid fats, added sugars, or salt.   Keep all follow-up visits as told by your health care provider. This is important.  Contact a health care provider if:   You stop taking your antidepressant medicines, and you have any of these symptoms:  ? Nausea.  ? Headache.  ? Feeling lightheaded.  ? Chills and body aches.  ? Not being able to sleep (insomnia).   You or your friends and family think your depression is getting worse.  Get help right away if:   You have thoughts of hurting yourself or others.  If you ever feel like you may hurt yourself or others, or have thoughts about taking your own life, get help right away. You can go to your nearest emergency department or call:   Your local emergency services (911 in the [place]   A suicide crisis helpline, such as the  National Suicide Prevention Lifeline at 1-800-273-8255. This is open 24-hours a day.  Summary   If you are living with depression, there are ways to help you recover from it and also ways to prevent it from coming back.   Work with your health care team to create a management plan that includes counseling, stress management techniques, and healthy lifestyle habits.  This information is not intended to replace advice given to you by your health care provider. Make sure you discuss any questions you have with your health care provider.  Document Released: 05/10/2016 Document Revised: 05/10/2016 Document Reviewed: 05/10/2016  Elsevier Interactive Patient Education  2019 [employer]vier Inc[employer]
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
## 2018-10-30 NOTE — Progress Notes (Signed)
D: Pt A & O X 4. Denies SI, HI, AVH and pain at this time. D/C home as ordered. Picked up in lobby by [employer] related to felony charges. A: D/C instructions reviewed with pt including prescription and follow up appointment, compliance encouraged. All belongings from locker #[id number] given to pt at time of departure. Safety checks maintained without incident till time of d/c.  R: Denies adverse drug reactions when assessed. Verbalized understanding related to d/c instructions. Signed belonging sheet in agreement with items received from locker. Ambulatory with a steady gait. Appears to be in no physical distress at time of departure.
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
## 2019-04-18 ENCOUNTER — Emergency Department (HOSPITAL_BASED_OUTPATIENT_CLINIC_OR_DEPARTMENT_OTHER)
Admission: EM | Admit: 2019-04-18 | Discharge: 2019-04-18 | Disposition: A | Discharge status: Home / Self Care | Payer: Self-pay | Attending: Emergency Medicine | Admitting: Emergency Medicine

## 2019-04-18 ENCOUNTER — Emergency Department (HOSPITAL_BASED_OUTPATIENT_CLINIC_OR_DEPARTMENT_OTHER): Payer: Self-pay

## 2019-04-18 ENCOUNTER — Encounter (HOSPITAL_BASED_OUTPATIENT_CLINIC_OR_DEPARTMENT_OTHER): Payer: Self-pay

## 2019-04-18 ENCOUNTER — Other Ambulatory Visit: Payer: Self-pay

## 2019-04-18 DIAGNOSIS — W1830XA Fall on same level, unspecified, initial encounter: Secondary | ICD-10-CM | POA: Insufficient documentation

## 2019-04-18 DIAGNOSIS — Y939 Activity, unspecified: Secondary | ICD-10-CM | POA: Insufficient documentation

## 2019-04-18 DIAGNOSIS — Y929 Unspecified place or not applicable: Secondary | ICD-10-CM | POA: Insufficient documentation

## 2019-04-18 DIAGNOSIS — F17210 Nicotine dependence, cigarettes, uncomplicated: Secondary | ICD-10-CM | POA: Insufficient documentation

## 2019-04-18 DIAGNOSIS — Z79899 Other long term (current) drug therapy: Secondary | ICD-10-CM | POA: Insufficient documentation

## 2019-04-18 DIAGNOSIS — S93402A Sprain of unspecified ligament of left ankle, initial encounter: Secondary | ICD-10-CM | POA: Insufficient documentation

## 2019-04-18 DIAGNOSIS — Y999 Unspecified external cause status: Secondary | ICD-10-CM | POA: Insufficient documentation
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
## 2019-04-18 NOTE — Discharge Instructions (Addendum)
Rest.  Ice for 20 minutes every 2 hours while awake for the next 2 days.  Ibuprofen 600 mg every 6 hours as needed for pain.  Follow-up with primary doctor if not improving in the next 1 to 2 weeks.
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
## 2019-05-12 ENCOUNTER — Other Ambulatory Visit: Payer: Self-pay

## 2019-05-12 ENCOUNTER — Emergency Department (HOSPITAL_COMMUNITY)
Admission: EM | Admit: 2019-05-12 | Discharge: 2019-05-12 | Disposition: A | Discharge status: Home / Self Care | Payer: Self-pay | Attending: Emergency Medicine | Admitting: Emergency Medicine

## 2019-05-12 DIAGNOSIS — R45851 Suicidal ideations: Secondary | ICD-10-CM | POA: Insufficient documentation

## 2019-05-12 DIAGNOSIS — F1014 Alcohol abuse with alcohol-induced mood disorder: Secondary | ICD-10-CM

## 2019-05-12 DIAGNOSIS — F10920 Alcohol use, unspecified with intoxication, uncomplicated: Secondary | ICD-10-CM | POA: Insufficient documentation

## 2019-05-12 DIAGNOSIS — F17210 Nicotine dependence, cigarettes, uncomplicated: Secondary | ICD-10-CM | POA: Insufficient documentation

## 2019-05-12 DIAGNOSIS — Y906 Blood alcohol level of 120-199 mg/100 ml: Secondary | ICD-10-CM | POA: Insufficient documentation

## 2019-05-12 DIAGNOSIS — F319 Bipolar disorder, unspecified: Secondary | ICD-10-CM | POA: Insufficient documentation

## 2019-05-12 DIAGNOSIS — F1994 Other psychoactive substance use, unspecified with psychoactive substance-induced mood disorder: Secondary | ICD-10-CM | POA: Diagnosis present

## 2019-05-12 LAB — CBC WITH DIFFERENTIAL/PLATELET
Abs Immature Granulocytes: 0.01 10*3/uL (ref 0.00–0.07)
Basophils Absolute: 0 10*3/uL (ref 0.0–0.1)
Basophils Relative: 1 %
Eosinophils Absolute: 0.1 10*3/uL (ref 0.0–0.5)
Eosinophils Relative: 1 %
HCT: 41.2 % (ref 39.0–52.0)
Hemoglobin: 13.2 g/dL (ref 13.0–17.0)
Immature Granulocytes: 0 %
Lymphocytes Relative: 31 %
Lymphs Abs: 1.7 10*3/uL (ref 0.7–4.0)
MCH: 28.5 pg (ref 26.0–34.0)
MCHC: 32 g/dL (ref 30.0–36.0)
MCV: 89 fL (ref 80.0–100.0)
Monocytes Absolute: 0.4 10*3/uL (ref 0.1–1.0)
Monocytes Relative: 8 %
Neutro Abs: 3.2 10*3/uL (ref 1.7–7.7)
Neutrophils Relative %: 59 %
Platelets: 457 10*3/uL — ABNORMAL HIGH (ref 150–400)
RBC: 4.63 MIL/uL (ref 4.22–5.81)
RDW: 12.6 % (ref 11.5–15.5)
WBC: 5.4 10*3/uL (ref 4.0–10.5)
nRBC: 0 % (ref 0.0–0.2)

## 2019-05-12 LAB — COMPREHENSIVE METABOLIC PANEL
ALT: 22 U/L (ref 0–44)
AST: 19 U/L (ref 15–41)
Albumin: 4.2 g/dL (ref 3.5–5.0)
Alkaline Phosphatase: 64 U/L (ref 38–126)
Anion gap: 12 (ref 5–15)
BUN: 10 mg/dL (ref 6–20)
CO2: 21 mmol/L — ABNORMAL LOW (ref 22–32)
Calcium: 9.4 mg/dL (ref 8.9–10.3)
Chloride: 108 mmol/L (ref 98–111)
Creatinine, Ser: 1.27 mg/dL — ABNORMAL HIGH (ref 0.61–1.24)
GFR calc Af Amer: >60 mL/min (ref >60)
GFR calc non Af Amer: >60 mL/min (ref >60)
Glucose, Bld: 124 mg/dL — ABNORMAL HIGH (ref 70–99)
Potassium: 3.7 mmol/L (ref 3.5–5.1)
Sodium: 141 mmol/L (ref 135–145)
Total Bilirubin: 0.3 mg/dL (ref 0.3–1.2)
Total Protein: 8 g/dL (ref 6.5–8.1)

## 2019-05-12 LAB — CBG MONITORING, ED: Glucose-Capillary: 226 mg/dL — ABNORMAL HIGH (ref 70–99)

## 2019-05-12 LAB — ETHANOL: Alcohol, Ethyl (B): 137 mg/dL — ABNORMAL HIGH (ref <10)

## 2019-05-12 NOTE — ED Notes (Signed)
All belongings inventoried, in locker 5.

## 2019-05-12 NOTE — ED Notes (Signed)
Per BH, pt to be observed in ED and evaluated by Psychiatry in AM due to intoxication.

## 2019-05-12 NOTE — ED Notes (Signed)
Outpt and Substance Abuse Resources discussed and given.

## 2019-05-12 NOTE — ED Notes (Signed)
Pt.s lunch has arrived.  

## 2019-05-12 NOTE — ED Notes (Signed)
Pt. Woke up and this writer, Sharyn Lull NT, encourage pt. To eat his breakfast. PT. Ate some of his breakfast and asked to go back to bed.
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
## 2019-05-12 NOTE — Discharge Instructions (Signed)
Follow-up as instructed by behavioral

## 2019-05-12 NOTE — ED Triage Notes (Addendum)
Pt arrived via [employer], advised patient had been drinking heavily and also complained of suicidal ideation. Patient advises he has thoughts of harming himself, denies a plan of action or HI at this time. Pt is alert and oriented (x4), VS stable. Pt requesting psychiatric evaluation.

## 2019-05-12 NOTE — ED Notes (Signed)
Pt arrived to Rm 50 - ambulatory - wearing blue paper scrubs. Sitter w/pt. Urine specimen cup given - pt aware of need for urine specimen.

## 2019-05-12 NOTE — ED Notes (Addendum)
D/C instructions given and questions answered to satisfaction - Pt voiced understanding - did not sign for d/c paperwork. ALL belongings - 1 labeled belongings bag - returned to pt - Pt verbalized all items present. [employer] transporting pt.

## 2019-05-12 NOTE — ED Notes (Signed)
Telepsych being performed. 

## 2019-05-12 NOTE — ED Notes (Signed)
Ordered breakfast--Edward Mora 

## 2019-05-12 NOTE — ED Notes (Signed)
Pt. Spoke on the phone with his mother.

## 2019-05-12 NOTE — ED Notes (Signed)
Pt. Moved to room 50 in [place] zone. Explained to pt., we needed a urine specimen from him. Pt., voiced understanding.

## 2019-05-12 NOTE — ED Notes (Signed)
Pt wanded, belongings and hazards removed. Patient in paper scrubs. TTS initiated.

## 2019-05-12 NOTE — ED Notes (Signed)
Pt. Spoke to TTS. 

## 2019-05-12 NOTE — ED Notes (Addendum)
Pt's mother, Anne Ng, called asking if anyone had attempted to reach his fiance', [name] [phone] - as she had a missed phone call. Advised she and pt's fiance' feel safe if pt is d/c'd to home - states his fiance' is aware of his mental issues. States fiance' will be the one picking him up if d/c'd if pt is d/c'd prior to 1800 this evening. Pt on phone talking w/them at this time. RN spoke w/[name] - who requested to come visit pt. Advised no visitation at this time and will call her back w/pt's disposition w/in the hour.

## 2019-05-27 ENCOUNTER — Other Ambulatory Visit: Payer: Self-pay

## 2019-05-27 ENCOUNTER — Ambulatory Visit (HOSPITAL_COMMUNITY)
Admission: EM | Admit: 2019-05-27 | Discharge: 2019-05-27 | Disposition: A | Discharge status: Home / Self Care | Payer: Self-pay

## 2019-05-27 NOTE — ED Notes (Signed)
No answer from patient x3 

## 2019-05-28 ENCOUNTER — Ambulatory Visit (HOSPITAL_COMMUNITY)
Admission: EM | Admit: 2019-05-28 | Discharge: 2019-05-28 | Disposition: A | Discharge status: Home / Self Care | Payer: Self-pay | Attending: Internal Medicine | Admitting: Internal Medicine

## 2019-05-28 ENCOUNTER — Encounter (HOSPITAL_COMMUNITY): Payer: Self-pay

## 2019-05-28 ENCOUNTER — Other Ambulatory Visit: Payer: Self-pay

## 2019-05-28 DIAGNOSIS — Z760 Encounter for issue of repeat prescription: Secondary | ICD-10-CM

## 2019-05-28 DIAGNOSIS — F319 Bipolar disorder, unspecified: Secondary | ICD-10-CM

## 2019-05-28 MED ORDER — RISPERIDONE 1 MG PO TABS: 1.0000 mg | ORAL_TABLET | Freq: Every day | ORAL | 0 refills | Status: DC

## 2019-05-28 MED ORDER — TRAZODONE HCL 100 MG PO TABS: 100.0000 mg | ORAL_TABLET | Freq: Every evening | ORAL | 0 refills | Status: DC | PRN
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
## 2019-05-28 NOTE — ED Triage Notes (Signed)
Pt presents for medication refill of risperidone and divalporex.

## 2019-08-08 ENCOUNTER — Ambulatory Visit (HOSPITAL_COMMUNITY)
Admission: EM | Admit: 2019-08-08 | Discharge: 2019-08-08 | Disposition: A | Discharge status: Home / Self Care | Payer: HRSA Program | Attending: Family Medicine | Admitting: Family Medicine

## 2019-08-08 ENCOUNTER — Encounter (HOSPITAL_COMMUNITY): Payer: Self-pay

## 2019-08-08 ENCOUNTER — Other Ambulatory Visit: Payer: Self-pay

## 2019-08-08 ENCOUNTER — Ambulatory Visit (INDEPENDENT_AMBULATORY_CARE_PROVIDER_SITE_OTHER): Payer: HRSA Program

## 2019-08-08 DIAGNOSIS — R05 Cough: Secondary | ICD-10-CM

## 2019-08-08 DIAGNOSIS — F17210 Nicotine dependence, cigarettes, uncomplicated: Secondary | ICD-10-CM | POA: Insufficient documentation

## 2019-08-08 DIAGNOSIS — R059 Cough, unspecified: Secondary | ICD-10-CM

## 2019-08-08 DIAGNOSIS — Z20822 Contact with and (suspected) exposure to covid-19: Secondary | ICD-10-CM | POA: Insufficient documentation

## 2019-08-08 NOTE — ED Triage Notes (Signed)
Pt presents to UC for COVID testing. Pt reports having intermittent fever and cough x 3 weeks.

## 2019-08-08 NOTE — Discharge Instructions (Signed)
Your x ray was normal We have tested you for COVID This cough may be allergy related You can try some zyrtec daily.
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
## 2019-08-11 LAB — NOVEL CORONAVIRUS, NAA (HOSP ORDER, SEND-OUT TO REF LAB; TAT 18-24 HRS): SARS-CoV-2, NAA: NOT DETECTED

## 2020-02-10 ENCOUNTER — Other Ambulatory Visit: Payer: Self-pay

## 2020-02-10 ENCOUNTER — Ambulatory Visit (HOSPITAL_COMMUNITY)
Admission: EM | Admit: 2020-02-10 | Discharge: 2020-02-10 | Disposition: A | Discharge status: Home / Self Care | Payer: Self-pay

## 2020-02-16 ENCOUNTER — Ambulatory Visit (HOSPITAL_COMMUNITY)
Admission: EM | Admit: 2020-02-16 | Discharge: 2020-02-16 | Disposition: A | Discharge status: Home / Self Care | Payer: Self-pay

## 2020-02-16 ENCOUNTER — Encounter (HOSPITAL_COMMUNITY): Payer: Self-pay

## 2020-02-16 DIAGNOSIS — Z760 Encounter for issue of repeat prescription: Secondary | ICD-10-CM

## 2020-02-16 NOTE — ED Triage Notes (Signed)
Pt needs a RX refill on resperidal 1mg . Pt wasn't sure what med it was for his bipolar, but the resperidal is the only one listed. Pt doesn't have a psychiatrist at the moment.
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
## 2020-02-16 NOTE — Discharge Instructions (Signed)
Schedule an appointment with the behavioral health clinic listed below as soon as possible.

## 2020-04-09 ENCOUNTER — Telehealth (HOSPITAL_COMMUNITY): Payer: Self-pay | Admitting: Psychiatry

## 2020-04-16 ENCOUNTER — Telehealth (INDEPENDENT_AMBULATORY_CARE_PROVIDER_SITE_OTHER): Payer: No Payment, Other | Admitting: Physician Assistant

## 2020-04-16 DIAGNOSIS — F3113 Bipolar disorder, current episode manic without psychotic features, severe: Secondary | ICD-10-CM | POA: Diagnosis not present

## 2020-04-16 MED ORDER — QUETIAPINE FUMARATE 100 MG PO TABS: 100.0000 mg | ORAL_TABLET | Freq: Every day | ORAL | 1 refills | Status: DC
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
## 2020-05-04 ENCOUNTER — Encounter (HOSPITAL_COMMUNITY): Payer: Self-pay | Admitting: Physician Assistant

## 2020-05-14 ENCOUNTER — Other Ambulatory Visit: Payer: Self-pay

## 2020-05-14 ENCOUNTER — Telehealth (INDEPENDENT_AMBULATORY_CARE_PROVIDER_SITE_OTHER): Payer: No Payment, Other | Admitting: Physician Assistant

## 2020-05-14 DIAGNOSIS — F411 Generalized anxiety disorder: Secondary | ICD-10-CM | POA: Diagnosis not present

## 2020-05-14 DIAGNOSIS — F3113 Bipolar disorder, current episode manic without psychotic features, severe: Secondary | ICD-10-CM

## 2020-05-14 MED ORDER — QUETIAPINE FUMARATE 100 MG PO TABS: 100.0000 mg | ORAL_TABLET | Freq: Every day | ORAL | 0 refills | Status: DC

## 2020-05-14 MED ORDER — HYDROXYZINE HCL 10 MG PO TABS: 10.0000 mg | ORAL_TABLET | Freq: Three times a day (TID) | ORAL | 1 refills | Status: DC | PRN
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
## 2020-05-16 ENCOUNTER — Encounter (HOSPITAL_COMMUNITY): Payer: Self-pay | Admitting: Physician Assistant

## 2020-06-24 ENCOUNTER — Telehealth (HOSPITAL_COMMUNITY): Payer: No Payment, Other | Admitting: Physician Assistant

## 2020-06-24 ENCOUNTER — Other Ambulatory Visit: Payer: Self-pay

## 2022-06-23 ENCOUNTER — Ambulatory Visit
Admission: EM | Admit: 2022-06-23 | Discharge: 2022-06-23 | Disposition: A | Discharge status: Home / Self Care | Payer: Self-pay | Attending: Internal Medicine | Admitting: Internal Medicine

## 2022-06-23 DIAGNOSIS — J029 Acute pharyngitis, unspecified: Secondary | ICD-10-CM | POA: Insufficient documentation

## 2022-06-23 DIAGNOSIS — J069 Acute upper respiratory infection, unspecified: Secondary | ICD-10-CM | POA: Insufficient documentation

## 2022-06-23 DIAGNOSIS — Z1152 Encounter for screening for COVID-19: Secondary | ICD-10-CM | POA: Insufficient documentation

## 2022-06-23 LAB — POCT RAPID STREP A (OFFICE): Rapid Strep A Screen: NEGATIVE

## 2022-06-23 MED ORDER — BENZONATATE 100 MG PO CAPS: 100.0000 mg | ORAL_CAPSULE | Freq: Three times a day (TID) | ORAL | 0 refills | Status: DC | PRN

## 2022-06-23 MED ORDER — CHLORASEPTIC 1.4 % MT LIQD: 1.0000 | OROMUCOSAL | 0 refills | Status: DC | PRN

## 2022-06-23 MED ORDER — FLUTICASONE PROPIONATE 50 MCG/ACT NA SUSP: 1.0000 | Freq: Every day | NASAL | 0 refills | Status: DC

## 2022-06-23 MED ORDER — ACETAMINOPHEN 325 MG PO TABS
650.0000 mg | ORAL_TABLET | Freq: Once | ORAL | Status: AC
  Administered 2022-06-23: 650 mg via ORAL

## 2022-06-23 NOTE — Discharge Instructions (Signed)
Rapid strep was negative.  Throat culture and COVID test pending.  We will call if it is positive.  It appears that you have a viral illness that should run its course and self resolve with symptomatic treatment as we discussed.  I have prescribed you 3 different medications to help alleviate symptoms.  You may follow-up if symptoms persist or worsen.

## 2022-06-23 NOTE — ED Triage Notes (Signed)
Pt states that he has a sore throat, cough, and chest congestion. X3 days
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
## 2022-06-24 LAB — SARS CORONAVIRUS 2 (TAT 6-24 HRS): SARS Coronavirus 2: NEGATIVE

## 2022-06-26 LAB — CULTURE, GROUP A STREP (THRC)

## 2022-08-05 ENCOUNTER — Ambulatory Visit (HOSPITAL_COMMUNITY)
Admission: EM | Admit: 2022-08-05 | Discharge: 2022-08-05 | Disposition: A | Discharge status: Home / Self Care | Payer: BLUE CROSS/BLUE SHIELD | Attending: Psychiatry | Admitting: Psychiatry

## 2022-08-05 DIAGNOSIS — F3130 Bipolar disorder, current episode depressed, mild or moderate severity, unspecified: Secondary | ICD-10-CM | POA: Diagnosis not present

## 2022-08-05 MED ORDER — QUETIAPINE FUMARATE 100 MG PO TABS: 100.0000 mg | ORAL_TABLET | Freq: Every day | ORAL | 0 refills | Status: DC

## 2022-08-05 MED ORDER — QUETIAPINE FUMARATE 100 MG PO TABS: 100.0000 mg | ORAL_TABLET | Freq: Every day | ORAL | Status: DC

## 2022-08-05 NOTE — Discharge Summary (Signed)
Edward Mora to be D/C'd Home per NP order. An After Visit Summary was printed and given to the patient by provider. Patient escorted out, and D/C home via private auto.  Edward Mora  08/05/2022 10:39 AM

## 2022-08-05 NOTE — Progress Notes (Signed)
   08/05/22 0923  [place] (Walk-ins at [facility] only)  How Did You Hear About [place]? Other (Comment) (FSOP)  What Is the Reason for Your Visit/Call Today? Edward Mora is a 37 year old male presenting to [facility] with chief complaint of needing a medication refill. Patient was released from prison in December and now he is about to run out of medications. Patient is here with his sister who reports a couple of days ago patient had an episode where he was hallucinating and hitting himself in the head. Pt reports AH of hearing his son voice that died 5 years ago. Sister reports sometimes the voices are command voices. Patient also reports VH of shadows and images of his son. Pt reports last hallucination was a couple of days ago. Sister reports that patient was running out of medications so he was not taking them like prescribed to make them last. Sister reports patient has one day worth of medications left. Pt receives therapy at [facility] and they recommended him to come here today. Pt reports diagnosis of bipolar, schizophrenia and ADHD. Pt denies SI, HI and drug use.  How Long Has This Been Causing You Problems? 1 wk - 1 month  Have You Recently Had Any Thoughts About Hurting Yourself? No  Are You Planning to Commit Suicide/Harm Yourself At This time? No  Have you Recently Had Thoughts About [place]? No  Are You Planning To Harm Someone At This Time? No  Are you currently experiencing any auditory, visual or other hallucinations? No  Have You Used Any Alcohol or Drugs in the Past 24 Hours? No  Do you have any current medical co-morbidities that require immediate attention? No  Clinician description of patient physical appearance/behavior: calm  What Do You Feel Would Help You the Most Today? Treatment for Depression or other mood problem  If access to [facility] was not available, would you have sought care in the Emergency Department? No  Determination of Need Routine (7 days)   Options For Referral Medication Management

## 2022-08-05 NOTE — Discharge Instructions (Addendum)
Patient is instructed prior to discharge to:  Take all medications as prescribed by his/her mental healthcare provider. Report any adverse effects and or reactions from the medicines to his/her outpatient provider promptly. Keep all scheduled appointments, to ensure that you are getting refills on time and to avoid any interruption in your medication.  If you are unable to keep an appointment call to reschedule.  Be sure to follow-up with resources and follow-up appointments provided.  Patient has been instructed & cautioned: To not engage in alcohol and or illegal drug use while on prescription medicines. In the event of worsening symptoms, patient is instructed to call the crisis hotline, 911 and or go to the nearest ED for appropriate evaluation and treatment of symptoms. To follow-up with his/her primary care provider for your other medical issues, concerns and or health care needs.  Information: -National Suicide Prevention Lifeline 1-800-SUICIDE or 802-853-1157.  -988 offers 24/7 access to trained crisis counselors who can help people experiencing mental health-related distress. People can call or text 988 or chat 988lifeline.org for themselves or if they are worried about a loved one who may need crisis support.
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
## 2022-08-23 DIAGNOSIS — F411 Generalized anxiety disorder: Secondary | ICD-10-CM | POA: Diagnosis not present

## 2022-08-23 DIAGNOSIS — F4312 Post-traumatic stress disorder, chronic: Secondary | ICD-10-CM | POA: Diagnosis not present

## 2022-08-23 DIAGNOSIS — F250 Schizoaffective disorder, bipolar type: Secondary | ICD-10-CM | POA: Diagnosis not present

## 2022-10-17 ENCOUNTER — Other Ambulatory Visit: Payer: Self-pay

## 2022-10-17 ENCOUNTER — Emergency Department (HOSPITAL_COMMUNITY)
Admission: EM | Admit: 2022-10-17 | Discharge: 2022-10-17 | Disposition: A | Discharge status: Home / Self Care | Payer: Self-pay | Attending: Emergency Medicine | Admitting: Emergency Medicine

## 2022-10-17 DIAGNOSIS — F1994 Other psychoactive substance use, unspecified with psychoactive substance-induced mood disorder: Secondary | ICD-10-CM | POA: Insufficient documentation

## 2022-10-17 DIAGNOSIS — F17290 Nicotine dependence, other tobacco product, uncomplicated: Secondary | ICD-10-CM | POA: Insufficient documentation

## 2022-10-17 DIAGNOSIS — E860 Dehydration: Secondary | ICD-10-CM | POA: Insufficient documentation

## 2022-10-17 DIAGNOSIS — Y906 Blood alcohol level of 120-199 mg/100 ml: Secondary | ICD-10-CM | POA: Insufficient documentation

## 2022-10-17 DIAGNOSIS — Z008 Encounter for other general examination: Secondary | ICD-10-CM | POA: Insufficient documentation

## 2022-10-17 DIAGNOSIS — F19929 Other psychoactive substance use, unspecified with intoxication, unspecified: Secondary | ICD-10-CM

## 2022-10-17 LAB — RAPID URINE DRUG SCREEN, HOSP PERFORMED
Amphetamines: NOT DETECTED
Barbiturates: NOT DETECTED
Benzodiazepines: NOT DETECTED
Cocaine: NOT DETECTED
Opiates: NOT DETECTED
Tetrahydrocannabinol: NOT DETECTED

## 2022-10-17 LAB — COMPREHENSIVE METABOLIC PANEL
ALT: 29 U/L (ref 0–44)
AST: 31 U/L (ref 15–41)
Albumin: 4 g/dL (ref 3.5–5.0)
Alkaline Phosphatase: 58 U/L (ref 38–126)
Anion gap: 16 — ABNORMAL HIGH (ref 5–15)
BUN: 13 mg/dL (ref 6–20)
CO2: 18 mmol/L — ABNORMAL LOW (ref 22–32)
Calcium: 9.5 mg/dL (ref 8.9–10.3)
Chloride: 104 mmol/L (ref 98–111)
Creatinine, Ser: 1.67 mg/dL — ABNORMAL HIGH (ref 0.61–1.24)
GFR, Estimated: 54 mL/min — ABNORMAL LOW (ref >60)
Glucose, Bld: 111 mg/dL — ABNORMAL HIGH (ref 70–99)
Potassium: 4 mmol/L (ref 3.5–5.1)
Sodium: 138 mmol/L (ref 135–145)
Total Bilirubin: 0.5 mg/dL (ref 0.3–1.2)
Total Protein: 8.1 g/dL (ref 6.5–8.1)

## 2022-10-17 LAB — CBC WITH DIFFERENTIAL/PLATELET
Abs Immature Granulocytes: 0.04 10*3/uL (ref 0.00–0.07)
Basophils Absolute: 0 10*3/uL (ref 0.0–0.1)
Basophils Relative: 1 %
Eosinophils Absolute: 0.1 10*3/uL (ref 0.0–0.5)
Eosinophils Relative: 2 %
HCT: 39.8 % (ref 39.0–52.0)
Hemoglobin: 13.1 g/dL (ref 13.0–17.0)
Immature Granulocytes: 1 %
Lymphocytes Relative: 29 %
Lymphs Abs: 1.8 10*3/uL (ref 0.7–4.0)
MCH: 28.4 pg (ref 26.0–34.0)
MCHC: 32.9 g/dL (ref 30.0–36.0)
MCV: 86.1 fL (ref 80.0–100.0)
Monocytes Absolute: 0.6 10*3/uL (ref 0.1–1.0)
Monocytes Relative: 10 %
Neutro Abs: 3.7 10*3/uL (ref 1.7–7.7)
Neutrophils Relative %: 57 %
Platelets: 417 10*3/uL — ABNORMAL HIGH (ref 150–400)
RBC: 4.62 MIL/uL (ref 4.22–5.81)
RDW: 12.9 % (ref 11.5–15.5)
WBC: 6.3 10*3/uL (ref 4.0–10.5)
nRBC: 0 % (ref 0.0–0.2)

## 2022-10-17 LAB — ETHANOL: Alcohol, Ethyl (B): 146 mg/dL — ABNORMAL HIGH (ref <10)

## 2022-10-17 MED ORDER — ARIPIPRAZOLE 10 MG PO TABS
5.0000 mg | ORAL_TABLET | Freq: Every day | ORAL | Status: DC
  Administered 2022-10-17: 5 mg via ORAL
  Filled 2022-10-17: qty 1

## 2022-10-17 MED ORDER — ARIPIPRAZOLE 5 MG PO TABS: 5.0000 mg | ORAL_TABLET | Freq: Every day | ORAL | 0 refills | Status: DC

## 2022-10-17 MED ORDER — SODIUM CHLORIDE 0.9 % IV BOLUS
1000.0000 mL | Freq: Once | INTRAVENOUS | Status: AC
  Administered 2022-10-17: 1000 mL via INTRAVENOUS

## 2022-10-17 NOTE — Discharge Instructions (Addendum)

## 2022-10-17 NOTE — ED Provider Notes (Signed)
Pt has been cleared by psychiatry,  Not suicidal, VS normal - no tachycardia Not a danger to himself or others Contracts for safety.   Eber Hong, MD 10/17/22 1026
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
## 2022-10-17 NOTE — ED Notes (Signed)
Patient resting in bed, no s/s of distress, family at bedside, will continue to monitor.
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
## 2022-10-17 NOTE — ED Notes (Signed)
Pt up using restroom, wife at bedside.

## 2022-11-27 ENCOUNTER — Other Ambulatory Visit: Payer: Self-pay

## 2022-11-27 ENCOUNTER — Emergency Department (HOSPITAL_COMMUNITY)
Admission: EM | Admit: 2022-11-27 | Discharge: 2022-11-28 | Disposition: A | Discharge status: Home / Self Care | Payer: Commercial Managed Care - HMO | Attending: Emergency Medicine | Admitting: Emergency Medicine

## 2022-11-27 ENCOUNTER — Emergency Department (HOSPITAL_COMMUNITY): Payer: Commercial Managed Care - HMO

## 2022-11-27 DIAGNOSIS — S0990XA Unspecified injury of head, initial encounter: Secondary | ICD-10-CM | POA: Diagnosis present

## 2022-11-27 DIAGNOSIS — S0993XA Unspecified injury of face, initial encounter: Secondary | ICD-10-CM | POA: Insufficient documentation

## 2022-11-27 DIAGNOSIS — S0081XA Abrasion of other part of head, initial encounter: Secondary | ICD-10-CM | POA: Insufficient documentation

## 2022-11-27 DIAGNOSIS — S0101XA Laceration without foreign body of scalp, initial encounter: Secondary | ICD-10-CM | POA: Diagnosis not present

## 2022-11-27 MED ORDER — LORAZEPAM 2 MG/ML IJ SOLN
INTRAMUSCULAR | Status: AC
  Administered 2022-11-27: 2 mg
  Filled 2022-11-27: qty 1

## 2022-11-27 MED ORDER — HALOPERIDOL LACTATE 5 MG/ML IJ SOLN
INTRAMUSCULAR | Status: AC
  Filled 2022-11-27: qty 1

## 2022-11-27 MED ORDER — HALOPERIDOL LACTATE 5 MG/ML IJ SOLN
INTRAMUSCULAR | Status: AC
  Administered 2022-11-27: 5 mg
  Filled 2022-11-27: qty 1
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
## 2022-11-28 ENCOUNTER — Emergency Department (HOSPITAL_COMMUNITY): Payer: Commercial Managed Care - HMO

## 2022-11-28 MED ORDER — BACITRACIN ZINC 500 UNIT/GM EX OINT: 1.0000 | TOPICAL_OINTMENT | Freq: Two times a day (BID) | CUTANEOUS | 0 refills | Status: DC

## 2022-11-28 MED ORDER — CEPHALEXIN 500 MG PO CAPS: 1000.0000 mg | ORAL_CAPSULE | Freq: Two times a day (BID) | ORAL | 0 refills | Status: DC

## 2022-11-28 MED ORDER — CEPHALEXIN 500 MG PO CAPS
1000.0000 mg | ORAL_CAPSULE | Freq: Once | ORAL | Status: AC
  Administered 2022-11-28: 1000 mg via ORAL
  Filled 2022-11-28: qty 2

## 2022-11-28 MED ORDER — FLUORESCEIN SODIUM 1 MG OP STRP
1.0000 | ORAL_STRIP | Freq: Once | OPHTHALMIC | Status: AC
  Administered 2022-11-28: 1 via OPHTHALMIC
  Filled 2022-11-28: qty 1

## 2022-11-28 MED ORDER — TETRACAINE HCL 0.5 % OP SOLN
2.0000 [drp] | Freq: Once | OPHTHALMIC | Status: AC
  Administered 2022-11-28: 2 [drp] via OPHTHALMIC
  Filled 2022-11-28: qty 4

## 2022-11-28 MED ORDER — BACITRACIN ZINC 500 UNIT/GM EX OINT
TOPICAL_OINTMENT | CUTANEOUS | Status: AC
  Filled 2022-11-28: qty 0.9

## 2022-11-28 MED ORDER — LIDOCAINE HCL (PF) 1 % IJ SOLN
20.0000 mL | Freq: Once | INTRAMUSCULAR | Status: AC
  Administered 2022-11-28: 20 mL
  Filled 2022-11-28: qty 30

## 2022-11-28 NOTE — Discharge Instructions (Addendum)
Evaluation today was overall reassuring.  Your CT scans were negative for acute injury.  Laceration repair of your scalp went well.  I did start you on Keflex to treat empirically for infection.  Advise you follow-up with your PCP in 5 to 7 days for wound reevaluation and possible suture removal.  If you have new facial droop, slurred speech, weakness or numbness in your extremities, change in your gait or any other concerning symptom please return emerged part for further evaluation.
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
## 2022-12-06 ENCOUNTER — Ambulatory Visit (HOSPITAL_COMMUNITY)
Admission: EM | Admit: 2022-12-06 | Discharge: 2022-12-06 | Disposition: A | Discharge status: Home / Self Care | Payer: Commercial Managed Care - HMO

## 2022-12-06 ENCOUNTER — Encounter (HOSPITAL_COMMUNITY): Payer: Self-pay

## 2022-12-06 DIAGNOSIS — Z4802 Encounter for removal of sutures: Secondary | ICD-10-CM

## 2022-12-06 NOTE — ED Triage Notes (Signed)
Pt is here for removal of suture. Pt does not remember how many suture were placed.
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
## 2022-12-06 NOTE — Discharge Instructions (Signed)
Ok to wash your hair. It might help to use some oil on it and rub it gently to help the dried blood come out before you wash it.

## 2023-01-25 ENCOUNTER — Ambulatory Visit (HOSPITAL_COMMUNITY)
Admission: EM | Admit: 2023-01-25 | Discharge: 2023-01-25 | Disposition: A | Discharge status: Home / Self Care | Payer: Commercial Managed Care - HMO | Attending: Emergency Medicine | Admitting: Emergency Medicine

## 2023-01-25 ENCOUNTER — Encounter (HOSPITAL_COMMUNITY): Payer: Self-pay

## 2023-01-25 DIAGNOSIS — M545 Low back pain, unspecified: Secondary | ICD-10-CM

## 2023-01-25 MED ORDER — CYCLOBENZAPRINE HCL 10 MG PO TABS: 10.0000 mg | ORAL_TABLET | Freq: Three times a day (TID) | ORAL | 0 refills | Status: DC | PRN

## 2023-01-25 NOTE — Discharge Instructions (Signed)
You can take the muscle relaxer (Flexeril) 3x daily. If the medication makes you drowsy, take only one at bed time.  Use tylenol every 4-6 hours Lidocaine patch, topical IcyHot or biofreeze  You can also use ice or hot pad Avoid heavy lifting and strenuous activity  It may take several days to a week for symptoms to improve.
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
## 2023-05-17 ENCOUNTER — Encounter (HOSPITAL_COMMUNITY): Payer: Self-pay | Admitting: Emergency Medicine

## 2023-05-17 ENCOUNTER — Ambulatory Visit (HOSPITAL_COMMUNITY)
Admission: EM | Admit: 2023-05-17 | Discharge: 2023-05-17 | Disposition: A | Discharge status: Home / Self Care | Payer: Commercial Managed Care - HMO | Attending: Nurse Practitioner | Admitting: Nurse Practitioner

## 2023-05-17 DIAGNOSIS — Z760 Encounter for issue of repeat prescription: Secondary | ICD-10-CM

## 2023-05-17 DIAGNOSIS — Z8659 Personal history of other mental and behavioral disorders: Secondary | ICD-10-CM

## 2023-05-17 MED ORDER — ARIPIPRAZOLE 5 MG PO TABS: 5.0000 mg | ORAL_TABLET | Freq: Every day | ORAL | 0 refills | Status: AC

## 2023-05-17 NOTE — ED Triage Notes (Signed)
Pt presents for medication refill on bipolar medication.
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
## 2023-05-17 NOTE — Discharge Instructions (Signed)
Resume the Abilify.  Keep your appointment with [facility] in a couple of weeks.  Seek care at [facility] behavioral health if your symptoms worsen while on the medicine.

## 2023-10-28 ENCOUNTER — Encounter: Payer: Self-pay | Admitting: Podiatry

## 2023-10-28 ENCOUNTER — Ambulatory Visit (INDEPENDENT_AMBULATORY_CARE_PROVIDER_SITE_OTHER): Payer: MEDICAID | Admitting: Podiatry

## 2023-10-28 VITALS — Ht 73.0 in | Wt 260.0 lb

## 2023-10-28 DIAGNOSIS — B351 Tinea unguium: Secondary | ICD-10-CM

## 2023-10-28 DIAGNOSIS — M79675 Pain in left toe(s): Secondary | ICD-10-CM | POA: Diagnosis not present

## 2023-10-28 DIAGNOSIS — M79674 Pain in right toe(s): Secondary | ICD-10-CM | POA: Diagnosis not present
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
## 2023-11-10 ENCOUNTER — Other Ambulatory Visit: Payer: Self-pay | Admitting: Podiatry

## 2023-11-18 ENCOUNTER — Ambulatory Visit (INDEPENDENT_AMBULATORY_CARE_PROVIDER_SITE_OTHER): Payer: MEDICAID | Admitting: Podiatry

## 2023-11-18 DIAGNOSIS — Z91199 Patient's noncompliance with other medical treatment and regimen due to unspecified reason: Secondary | ICD-10-CM

## 2023-11-21 NOTE — Progress Notes (Signed)
Patient did not show for scheduled appointment today.

## 2024-06-01 ENCOUNTER — Ambulatory Visit (HOSPITAL_COMMUNITY)
Admission: EM | Admit: 2024-06-01 | Discharge: 2024-06-01 | Disposition: A | Discharge status: Home / Self Care | Payer: MEDICAID

## 2024-06-01 ENCOUNTER — Encounter (HOSPITAL_COMMUNITY): Payer: Self-pay

## 2024-06-01 DIAGNOSIS — G9331 Postviral fatigue syndrome: Secondary | ICD-10-CM | POA: Diagnosis not present

## 2024-06-01 MED ORDER — PROMETHAZINE-DM 6.25-15 MG/5ML PO SYRP: 10.0000 mL | ORAL_SOLUTION | Freq: Three times a day (TID) | ORAL | 0 refills | Status: AC | PRN

## 2024-06-01 MED ORDER — AZELASTINE HCL 0.1 % NA SOLN: 1.0000 | Freq: Two times a day (BID) | NASAL | 0 refills | Status: AC
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
## 2024-06-01 NOTE — Discharge Instructions (Signed)
°  1. Postviral syndrome (Primary) - azelastine (ASTELIN) 0.1 % nasal spray; Place 1 spray into both nostrils 2 (two) times daily. Use in each nostril as directed  Dispense: 30 mL; Refill: 0 - promethazine -dextromethorphan (PROMETHAZINE -DM) 6.25-15 MG/5ML syrup; Take 10 mLs by mouth 3 (three) times daily as needed for cough.  Dispense: 240 mL; Refill: 0 - Use over-the-counter ibuprofen  or Tylenol  for any body aches or fever secondary to postviral syndrome. - Drink plenty of fluids, get plenty of rest, symptoms should improve over the next 3 to 5 days with consistent use of prescribed medication.  -Continue to monitor symptoms for any change in severity if there is any escalation of current symptoms or development of new symptoms follow-up in ER for further evaluation and management.

## 2024-06-01 NOTE — ED Triage Notes (Addendum)
 PT states he has nasal congestion and cough x 1 week. He is coughing the green discharge. No fever or diarrhea at this time. Has not taken any medication for relief.

## 2024-06-25 ENCOUNTER — Encounter: Payer: Self-pay | Admitting: Podiatry

## 2024-06-25 ENCOUNTER — Ambulatory Visit (INDEPENDENT_AMBULATORY_CARE_PROVIDER_SITE_OTHER): Payer: MEDICAID | Admitting: Podiatry

## 2024-06-25 DIAGNOSIS — M79674 Pain in right toe(s): Secondary | ICD-10-CM | POA: Diagnosis not present

## 2024-06-25 DIAGNOSIS — B351 Tinea unguium: Secondary | ICD-10-CM | POA: Diagnosis not present

## 2024-06-25 DIAGNOSIS — B353 Tinea pedis: Secondary | ICD-10-CM

## 2024-06-25 DIAGNOSIS — M79675 Pain in left toe(s): Secondary | ICD-10-CM

## 2024-06-25 MED ORDER — TOLNAFTATE 1 % EX POWD: 1.0000 | Freq: Two times a day (BID) | CUTANEOUS | 0 refills | Status: AC

## 2024-06-25 MED ORDER — MUPIROCIN 2 % EX OINT: 1.0000 | TOPICAL_OINTMENT | Freq: Two times a day (BID) | CUTANEOUS | 2 refills | Status: AC

## 2024-06-25 NOTE — Progress Notes (Signed)
 "      Subjective:  Patient ID: Edward Mora, male    DOB: 1986-05-17,  MRN: [id number]  CAMBELL Mora presents to clinic today for:  Chief Complaint  Patient presents with   Nail Problem    Onychomycosis of R great toe nail and minimal amount on L great toe nail. Diabetic A1c unknown control with Metformin. No anti coag  Patient comes in with complaint of onychomycosis affecting the great toenails.  Right is greater than left.  He was seen for this several months ago.  At that time did discuss potential treatment options including removal of the affected painful toenails versus management with medication.  The right toenail in particular does cause pain and is difficult for him to manage.  Also complaining of some maceration and rash present to the webspaces of both feet  PCP is Jori Small, FNP.  Allergies[1]  Review of Systems: Negative except as noted in the HPI.  Objective:  There were no vitals filed for this visit.  Edward Mora is a pleasant 39 y.o. male in NAD. AAO x 3.  Vascular Examination: Capillary refill time is less than 3 seconds to toes bilateral. Palpable pedal pulses b/l LE. Digital hair present b/l. No pedal edema b/l. Skin temperature gradient WNL b/l. No varicosities b/l. No cyanosis or clubbing noted b/l.   Dermatological Examination: Bilateral first toenails right greater than left is thickened greater than 3 mm, dystrophic with yellow discoloration and subungual debris.  Brittle appearance.  Tenderness on direct dorsal palpation.  There is also maceration rash present interdigital spaces bilaterally.  Neurological Examination: Protective sensation intact with Semmes-Weinstein 10 gram monofilament b/l LE. Vibratory sensation intact b/l LE.      No data to display           Assessment/Plan: 1. Pain due to onychomycosis of toenails of both feet   2. Tinea pedis of both feet     Meds ordered this encounter  Medications   mupirocin   ointment (BACTROBAN ) 2 %    Sig: Apply 1 Application topically 2 (two) times daily.    Dispense:  30 g    Refill:  2   tolnaftate  (TINACTIN) 1 % powder    Sig: Apply 1 Application topically 2 (two) times daily.    Dispense:  45 g    Refill:  0  # Pain due to onychomycosis of toenails - Review nail pathology results obtained at last visit - Did discuss various treatment options.  Discussed total nail avulsion with chemical matrixectomy versus oral medication or topical treatment.  Advised against oral medication due to potential for hepatic issues given history of liver damage, alcohol abuse, elevated liver enzymes.  Also discussed that extensive nail changes would be limited efficacy of topical treatment - Risk, benefits alternative therapies were discussed at length.  Patient was agreeable to proceeding.  Electing to defer left first toenail wishing to proceed with a right first today.  Written consent obtained. - Mupirocin  prescribed for aftercare of the total nail avulsion site  # Interdigital tinea pedis - Findings reviewed with patient - Discussed importance of pedal hygiene treatment and maintenance of tinea pedis - Prescription for topical tolnaftate  powder sent to patient's pharmacy -I certify that this diagnosis represents a distinct and separate diagnosis that requires evaluation and treatment separate from other procedures or diagnosis  Procedure: Total nail avulsion right first toenail with chemical matrixectomy Discussed patient's condition today.  After obtaining patient consent, the right hallux  was anesthetized with a 50:50 mixture of 1% lidocaine  plain and 0.5% bupivacaine plain for a total of 3cc's administered.  Betadine skin prep upon confirmation of anesthesia, a freer elevator was utilized to free the right hallux plate border from the nail bed.  The nail plate was then avulsed proximal to the eponychium and removed in toto.  The area was inspected for any remaining  spicules.  A chemical matrixectomy was performed with phenol and neutralized with alcohol.  Antibiotic ointment and a DSD were applied, followed by a Coban dressing.  Patient tolerated the anesthetic and procedure well and will f/u in 2-3 weeks for recheck.  Patient given post-procedure instructions for daily 20-minute Epsom salt soaks, antibiotic ointment and daily use of Bandaids until toe starts to dry / form eschar.    Return in about 2 weeks (around 07/09/2024) for Nail Check.   Ethan Saddler, DPM, AACFAS [facility]     [address] [place], [place] [address]                Office [phone]  Fax [phone]       [1] No Known Allergies  "

## 2024-06-25 NOTE — Patient Instructions (Signed)
 Place 1/4 cup of epsom salts in a quart of warm tap water.  Submerge your foot or feet in the solution and soak for 20 minutes.  This soak should be done twice a day.  Next, remove your foot or feet from solution, blot dry the affected area. Apply ointment and cover if instructed by your doctor.   IF YOUR SKIN BECOMES IRRITATED WHILE USING THESE INSTRUCTIONS, IT IS OKAY TO SWITCH TO  WHITE VINEGAR AND WATER.  As another alternative soak, you may use antibacterial soap and water.  Monitor for any signs/symptoms of infection. Call the office immediately if any occur or go directly to the emergency room. Call with any questions/concerns.  For the first 5 to 7 days, apply a small amount of antibacterial ointment.  After that time discontinue the use of any ointment.  Continue soaking bandage and toe until a dry scab fully formed.

## 2024-07-09 ENCOUNTER — Ambulatory Visit: Payer: MEDICAID | Admitting: Podiatry

## 2024-07-19 ENCOUNTER — Ambulatory Visit: Payer: MEDICAID | Admitting: Podiatry
